# Patient Record
Sex: Female | Born: 1940 | Race: White | Hispanic: No | Marital: Married | Smoking: Former smoker
Health system: Southern US, Community
[De-identification: ages and names within clinical notes are randomized; demographics above are authoritative.]

## PROBLEM LIST (undated history)

## (undated) DIAGNOSIS — Z8719 Personal history of other diseases of the digestive system: Secondary | ICD-10-CM

## (undated) DIAGNOSIS — E7800 Pure hypercholesterolemia, unspecified: Secondary | ICD-10-CM

## (undated) DIAGNOSIS — R0602 Shortness of breath: Secondary | ICD-10-CM

## (undated) DIAGNOSIS — K219 Gastro-esophageal reflux disease without esophagitis: Secondary | ICD-10-CM

## (undated) DIAGNOSIS — T4145XA Adverse effect of unspecified anesthetic, initial encounter: Secondary | ICD-10-CM

## (undated) DIAGNOSIS — I219 Acute myocardial infarction, unspecified: Secondary | ICD-10-CM

## (undated) DIAGNOSIS — K759 Inflammatory liver disease, unspecified: Secondary | ICD-10-CM

## (undated) DIAGNOSIS — I10 Essential (primary) hypertension: Secondary | ICD-10-CM

## (undated) DIAGNOSIS — I2510 Atherosclerotic heart disease of native coronary artery without angina pectoris: Secondary | ICD-10-CM

## (undated) DIAGNOSIS — I209 Angina pectoris, unspecified: Secondary | ICD-10-CM

## (undated) DIAGNOSIS — F419 Anxiety disorder, unspecified: Secondary | ICD-10-CM

## (undated) DIAGNOSIS — M797 Fibromyalgia: Secondary | ICD-10-CM

## (undated) HISTORY — PX: FRACTURE SURGERY: SHX138

## (undated) HISTORY — PX: APPENDECTOMY: SHX54

## (undated) HISTORY — PX: TONSILLECTOMY: SUR1361

## (undated) HISTORY — PX: DILATION AND CURETTAGE OF UTERUS: SHX78

## (undated) HISTORY — PX: ABDOMINAL HYSTERECTOMY: SHX81

## (undated) HISTORY — PX: CORONARY ANGIOPLASTY WITH STENT PLACEMENT: SHX49

---

## 1978-11-13 DIAGNOSIS — K759 Inflammatory liver disease, unspecified: Secondary | ICD-10-CM

## 1978-11-13 DIAGNOSIS — T8859XA Other complications of anesthesia, initial encounter: Secondary | ICD-10-CM

## 1978-11-13 HISTORY — PX: SHOULDER ARTHROSCOPY WITH OPEN ROTATOR CUFF REPAIR: SHX6092

## 1978-11-13 HISTORY — DX: Other complications of anesthesia, initial encounter: T88.59XA

## 1978-11-13 HISTORY — DX: Inflammatory liver disease, unspecified: K75.9

## 1988-11-12 HISTORY — PX: FOOT FRACTURE SURGERY: SHX645

## 1998-07-20 ENCOUNTER — Ambulatory Visit (HOSPITAL_COMMUNITY): Admission: RE | Admit: 1998-07-20 | Discharge: 1998-07-20 | Payer: Self-pay | Admitting: Cardiology

## 1998-07-20 ENCOUNTER — Encounter: Payer: Self-pay | Admitting: Cardiology

## 1999-04-16 ENCOUNTER — Encounter: Payer: Self-pay | Admitting: Cardiology

## 1999-04-16 ENCOUNTER — Ambulatory Visit (HOSPITAL_COMMUNITY): Admission: RE | Admit: 1999-04-16 | Discharge: 1999-04-16 | Payer: Self-pay | Admitting: Cardiology

## 1999-06-15 ENCOUNTER — Encounter: Payer: Self-pay | Admitting: Orthopedic Surgery

## 1999-06-15 ENCOUNTER — Encounter: Admission: RE | Admit: 1999-06-15 | Discharge: 1999-06-15 | Payer: Self-pay | Admitting: Orthopedic Surgery

## 2002-12-27 ENCOUNTER — Encounter: Admission: RE | Admit: 2002-12-27 | Discharge: 2002-12-27 | Payer: Self-pay | Admitting: Orthopedic Surgery

## 2002-12-27 ENCOUNTER — Encounter: Payer: Self-pay | Admitting: Orthopedic Surgery

## 2010-04-04 ENCOUNTER — Encounter: Payer: Self-pay | Admitting: Cardiology

## 2011-09-05 ENCOUNTER — Other Ambulatory Visit (HOSPITAL_COMMUNITY): Payer: Self-pay | Admitting: Cardiology

## 2011-09-05 DIAGNOSIS — R079 Chest pain, unspecified: Secondary | ICD-10-CM

## 2011-09-12 ENCOUNTER — Encounter (HOSPITAL_COMMUNITY)
Admission: RE | Admit: 2011-09-12 | Discharge: 2011-09-12 | Disposition: A | Discharge status: Home / Self Care | Payer: Medicare PPO | Source: Ambulatory Visit | Attending: Cardiology | Admitting: Cardiology

## 2011-09-12 ENCOUNTER — Other Ambulatory Visit: Payer: Self-pay

## 2011-09-12 DIAGNOSIS — R079 Chest pain, unspecified: Secondary | ICD-10-CM | POA: Insufficient documentation

## 2011-09-12 MED ORDER — REGADENOSON 0.4 MG/5ML IV SOLN
0.4000 mg | Freq: Once | INTRAVENOUS | Status: AC
  Administered 2011-09-12: 0.4 mg via INTRAVENOUS

## 2011-09-12 MED ORDER — TECHNETIUM TC 99M TETROFOSMIN IV KIT
30.0000 | PACK | Freq: Once | INTRAVENOUS | Status: AC | PRN
  Administered 2011-09-12: 30 via INTRAVENOUS

## 2011-09-12 MED ORDER — TECHNETIUM TC 99M TETROFOSMIN IV KIT
10.0000 | PACK | Freq: Once | INTRAVENOUS | Status: AC | PRN
  Administered 2011-09-12: 10 via INTRAVENOUS

## 2011-09-12 MED ORDER — REGADENOSON 0.4 MG/5ML IV SOLN
INTRAVENOUS | Status: AC
  Filled 2011-09-12: qty 5

## 2011-09-14 MED ORDER — HEPARIN (PORCINE) IN NACL 2-0.9 UNIT/ML-% IJ SOLN
INTRAMUSCULAR | Status: AC
  Filled 2011-09-14: qty 1000

## 2011-09-14 MED ORDER — HEPARIN SODIUM (PORCINE) 1000 UNIT/ML IJ SOLN
INTRAMUSCULAR | Status: AC
  Filled 2011-09-14: qty 1

## 2011-09-14 MED ORDER — CLOPIDOGREL BISULFATE 300 MG PO TABS
ORAL_TABLET | ORAL | Status: AC
  Filled 2011-09-14: qty 1

## 2011-09-14 MED ORDER — FENTANYL CITRATE 0.05 MG/ML IJ SOLN
INTRAMUSCULAR | Status: AC
  Filled 2011-09-14: qty 2

## 2011-09-14 MED ORDER — FAMOTIDINE IN NACL 20-0.9 MG/50ML-% IV SOLN
INTRAVENOUS | Status: AC
  Filled 2011-09-14: qty 50

## 2011-09-14 MED ORDER — LIDOCAINE HCL (PF) 1 % IJ SOLN
INTRAMUSCULAR | Status: AC
  Filled 2011-09-14: qty 30

## 2011-09-14 MED ORDER — MIDAZOLAM HCL 2 MG/2ML IJ SOLN
INTRAMUSCULAR | Status: AC
  Filled 2011-09-14: qty 2

## 2011-09-20 ENCOUNTER — Encounter (HOSPITAL_COMMUNITY): Payer: Self-pay | Admitting: Pharmacy Technician

## 2011-09-22 ENCOUNTER — Encounter (HOSPITAL_COMMUNITY)
Admission: RE | Disposition: A | Discharge status: Home / Self Care | Payer: Self-pay | Source: Ambulatory Visit | Attending: Cardiology

## 2011-09-22 ENCOUNTER — Encounter (HOSPITAL_COMMUNITY): Payer: Self-pay | Admitting: General Practice

## 2011-09-22 ENCOUNTER — Ambulatory Visit (HOSPITAL_COMMUNITY)
Admission: RE | Admit: 2011-09-22 | Discharge: 2011-09-23 | Disposition: A | Discharge status: Home / Self Care | Payer: Medicare PPO | Source: Ambulatory Visit | Attending: Cardiology | Admitting: Cardiology

## 2011-09-22 DIAGNOSIS — Z955 Presence of coronary angioplasty implant and graft: Secondary | ICD-10-CM

## 2011-09-22 DIAGNOSIS — E119 Type 2 diabetes mellitus without complications: Secondary | ICD-10-CM | POA: Insufficient documentation

## 2011-09-22 DIAGNOSIS — I2510 Atherosclerotic heart disease of native coronary artery without angina pectoris: Secondary | ICD-10-CM | POA: Insufficient documentation

## 2011-09-22 DIAGNOSIS — E7800 Pure hypercholesterolemia, unspecified: Secondary | ICD-10-CM | POA: Insufficient documentation

## 2011-09-22 DIAGNOSIS — I10 Essential (primary) hypertension: Secondary | ICD-10-CM | POA: Insufficient documentation

## 2011-09-22 DIAGNOSIS — F17200 Nicotine dependence, unspecified, uncomplicated: Secondary | ICD-10-CM | POA: Insufficient documentation

## 2011-09-22 DIAGNOSIS — Z8249 Family history of ischemic heart disease and other diseases of the circulatory system: Secondary | ICD-10-CM | POA: Insufficient documentation

## 2011-09-22 HISTORY — DX: Gastro-esophageal reflux disease without esophagitis: K21.9

## 2011-09-22 HISTORY — DX: Fibromyalgia: M79.7

## 2011-09-22 HISTORY — DX: Essential (primary) hypertension: I10

## 2011-09-22 HISTORY — DX: Angina pectoris, unspecified: I20.9

## 2011-09-22 HISTORY — DX: Personal history of other diseases of the digestive system: Z87.19

## 2011-09-22 HISTORY — DX: Shortness of breath: R06.02

## 2011-09-22 HISTORY — DX: Adverse effect of unspecified anesthetic, initial encounter: T41.45XA

## 2011-09-22 HISTORY — PX: PERCUTANEOUS CORONARY STENT INTERVENTION (PCI-S): SHX5485

## 2011-09-22 HISTORY — PX: LEFT HEART CATHETERIZATION WITH CORONARY ANGIOGRAM: SHX5451

## 2011-09-22 HISTORY — DX: Anxiety disorder, unspecified: F41.9

## 2011-09-22 HISTORY — DX: Atherosclerotic heart disease of native coronary artery without angina pectoris: I25.10

## 2011-09-22 LAB — GLUCOSE, CAPILLARY
Glucose-Capillary: 85 mg/dL (ref 70–99)
Glucose-Capillary: 118 mg/dL — ABNORMAL HIGH (ref 70–99)

## 2011-09-22 LAB — POCT ACTIVATED CLOTTING TIME: Activated Clotting Time: 334 seconds

## 2011-09-22 SURGERY — LEFT HEART CATHETERIZATION WITH CORONARY ANGIOGRAM
Anesthesia: LOCAL

## 2011-09-22 MED ORDER — NITROGLYCERIN IN D5W 200-5 MCG/ML-% IV SOLN
5.0000 ug/min | INTRAVENOUS | Status: DC
  Administered 2011-09-22: 5 ug/min via INTRAVENOUS

## 2011-09-22 MED ORDER — PANTOPRAZOLE SODIUM 40 MG PO TBEC
40.0000 mg | DELAYED_RELEASE_TABLET | Freq: Every day | ORAL | Status: DC
  Administered 2011-09-23 (×2): 40 mg via ORAL
  Filled 2011-09-22 (×2): qty 1

## 2011-09-22 MED ORDER — MIDAZOLAM HCL 2 MG/2ML IJ SOLN
INTRAMUSCULAR | Status: AC
  Filled 2011-09-22: qty 2

## 2011-09-22 MED ORDER — SODIUM CHLORIDE 0.9 % IJ SOLN: 3.0000 mL | INTRAMUSCULAR | Status: DC | PRN

## 2011-09-22 MED ORDER — METOPROLOL TARTRATE 12.5 MG HALF TABLET
12.5000 mg | ORAL_TABLET | Freq: Two times a day (BID) | ORAL | Status: DC
  Administered 2011-09-22 (×2): 12.5 mg via ORAL
  Filled 2011-09-22 (×4): qty 1

## 2011-09-22 MED ORDER — DIAZEPAM 5 MG PO TABS
ORAL_TABLET | ORAL | Status: AC
  Administered 2011-09-22: 5 mg via ORAL
  Filled 2011-09-22: qty 1

## 2011-09-22 MED ORDER — ACETAMINOPHEN 325 MG PO TABS: 650.0000 mg | ORAL_TABLET | ORAL | Status: DC | PRN

## 2011-09-22 MED ORDER — ONDANSETRON HCL 4 MG/2ML IJ SOLN: 4.0000 mg | Freq: Four times a day (QID) | INTRAMUSCULAR | Status: DC | PRN

## 2011-09-22 MED ORDER — ALPRAZOLAM 0.25 MG PO TABS: 0.2500 mg | ORAL_TABLET | Freq: Two times a day (BID) | ORAL | Status: DC | PRN

## 2011-09-22 MED ORDER — FENTANYL CITRATE 0.05 MG/ML IJ SOLN
INTRAMUSCULAR | Status: AC
  Filled 2011-09-22: qty 2

## 2011-09-22 MED ORDER — SODIUM CHLORIDE 0.9 % IV SOLN
INTRAVENOUS | Status: DC
  Administered 2011-09-22: 1000 mL via INTRAVENOUS

## 2011-09-22 MED ORDER — ASPIRIN 81 MG PO CHEW
CHEWABLE_TABLET | ORAL | Status: AC
  Administered 2011-09-22: 324 mg via ORAL
  Filled 2011-09-22: qty 4

## 2011-09-22 MED ORDER — BIVALIRUDIN 250 MG IV SOLR
INTRAVENOUS | Status: AC
  Filled 2011-09-22: qty 250

## 2011-09-22 MED ORDER — TICAGRELOR 90 MG PO TABS
90.0000 mg | ORAL_TABLET | Freq: Two times a day (BID) | ORAL | Status: DC
  Administered 2011-09-22: 90 mg via ORAL
  Filled 2011-09-22 (×4): qty 1

## 2011-09-22 MED ORDER — ASPIRIN 81 MG PO CHEW
324.0000 mg | CHEWABLE_TABLET | ORAL | Status: AC
  Administered 2011-09-22: 324 mg via ORAL

## 2011-09-22 MED ORDER — NITROGLYCERIN IN D5W 200-5 MCG/ML-% IV SOLN
INTRAVENOUS | Status: AC
  Filled 2011-09-22: qty 250

## 2011-09-22 MED ORDER — ASPIRIN 81 MG PO CHEW
81.0000 mg | CHEWABLE_TABLET | Freq: Every day | ORAL | Status: DC
  Administered 2011-09-23: 11:00:00 81 mg via ORAL
  Filled 2011-09-22 (×2): qty 1

## 2011-09-22 MED ORDER — TICAGRELOR 90 MG PO TABS
ORAL_TABLET | ORAL | Status: AC
  Filled 2011-09-22: qty 2

## 2011-09-22 MED ORDER — SODIUM CHLORIDE 0.9 % IV SOLN
INTRAVENOUS | Status: AC
  Administered 2011-09-22 (×2): via INTRAVENOUS

## 2011-09-22 MED ORDER — NITROGLYCERIN 0.2 MG/ML ON CALL CATH LAB
INTRAVENOUS | Status: AC
  Filled 2011-09-22: qty 1

## 2011-09-22 MED ORDER — ATORVASTATIN CALCIUM 80 MG PO TABS
80.0000 mg | ORAL_TABLET | Freq: Every day | ORAL | Status: DC
  Administered 2011-09-22: 22:00:00 80 mg via ORAL
  Filled 2011-09-22 (×2): qty 1

## 2011-09-22 MED ORDER — DIAZEPAM 5 MG PO TABS
5.0000 mg | ORAL_TABLET | ORAL | Status: AC
  Administered 2011-09-22: 5 mg via ORAL

## 2011-09-22 MED ORDER — LIDOCAINE HCL (PF) 1 % IJ SOLN
INTRAMUSCULAR | Status: AC
  Filled 2011-09-22: qty 30

## 2011-09-22 MED ORDER — SODIUM CHLORIDE 0.9 % IJ SOLN: 3.0000 mL | Freq: Two times a day (BID) | INTRAMUSCULAR | Status: DC

## 2011-09-22 MED ORDER — SODIUM CHLORIDE 0.9 % IV SOLN: 250.0000 mL | INTRAVENOUS | Status: DC | PRN

## 2011-09-22 MED ORDER — TICAGRELOR 90 MG PO TABS
ORAL_TABLET | ORAL | Status: AC
  Administered 2011-09-22: 90 mg via ORAL
  Filled 2011-09-22: qty 2

## 2011-09-22 MED ORDER — HEPARIN (PORCINE) IN NACL 2-0.9 UNIT/ML-% IJ SOLN
INTRAMUSCULAR | Status: AC
  Filled 2011-09-22: qty 2000

## 2011-09-22 NOTE — CV Procedure (Signed)
Left cardiac cath/PTCA stenting report dictated on 09/22/2011 the patient number is [id number]

## 2011-09-23 LAB — BASIC METABOLIC PANEL
CO2: 26 mEq/L (ref 19–32)
Calcium: 9.3 mg/dL (ref 8.4–10.5)
Creatinine, Ser: 0.84 mg/dL (ref 0.50–1.10)
GFR calc Af Amer: 80 mL/min — ABNORMAL LOW (ref >90)
GFR calc non Af Amer: 69 mL/min — ABNORMAL LOW (ref >90)
Sodium: 139 mEq/L (ref 135–145)

## 2011-09-23 LAB — CBC
MCH: 29.1 pg (ref 26.0–34.0)
MCHC: 34.5 g/dL (ref 30.0–36.0)
MCV: 84.2 fL (ref 78.0–100.0)
Platelets: 187 10*3/uL (ref 150–400)
RBC: 4.95 MIL/uL (ref 3.87–5.11)
RDW: 13.2 % (ref 11.5–15.5)

## 2011-09-23 MED ORDER — LISINOPRIL 10 MG PO TABS: 10.0000 mg | ORAL_TABLET | Freq: Every day | ORAL | Status: DC

## 2011-09-23 MED ORDER — METOPROLOL SUCCINATE ER 25 MG PO TB24: 25.0000 mg | ORAL_TABLET | Freq: Every day | ORAL | Status: DC

## 2011-09-23 MED ORDER — PANTOPRAZOLE SODIUM 40 MG PO TBEC: 40.0000 mg | DELAYED_RELEASE_TABLET | Freq: Every day | ORAL | Status: DC

## 2011-09-23 MED ORDER — NITROGLYCERIN 0.4 MG SL SUBL: 0.4000 mg | SUBLINGUAL_TABLET | SUBLINGUAL | Status: DC | PRN

## 2011-09-23 MED ORDER — ASPIRIN 81 MG PO CHEW: 81.0000 mg | CHEWABLE_TABLET | Freq: Every day | ORAL | Status: AC

## 2011-09-23 MED ORDER — NITROGLYCERIN 0.3 MG SL SUBL: 0.3000 mg | SUBLINGUAL_TABLET | SUBLINGUAL | Status: DC | PRN

## 2011-09-23 MED ORDER — TICAGRELOR 90 MG PO TABS: 90.0000 mg | ORAL_TABLET | Freq: Two times a day (BID) | ORAL | Status: DC

## 2011-09-23 MED ORDER — ATORVASTATIN CALCIUM 80 MG PO TABS: 80.0000 mg | ORAL_TABLET | Freq: Every day | ORAL | Status: DC

## 2011-09-23 MED FILL — Dextrose Inj 5%: INTRAVENOUS | Qty: 1000 | Status: AC

## 2011-09-23 NOTE — Discharge Summary (Signed)
  Discharge summary dictated on 09/23/2011 dictation number is [id number]

## 2011-09-23 NOTE — Cardiovascular Report (Signed)
NAMESUDIE, BANDEL          ACCOUNT NO.:  [id number]  MEDICAL RECORD NO.:  [id number]  LOCATION:  6531                         FACILITY:  [facility]  PHYSICIAN:  Eduardo Osier. Sharyn Lull, M.D. DATE OF BIRTH:  30-Oct-1940  DATE OF PROCEDURE:  09/22/2011 DATE OF DISCHARGE:                           CARDIAC CATHETERIZATION   PROCEDURES: 1. Left cardiac cath with selective left and right coronary     angiography, left ventriculography via right groin using Judkins     technique. 2. Successful PTCA to chronically occluded mid and distal left     circumflex using initially 1.5 x 15-mm long Trek balloon and then     2.5 x 20-mm long Trek balloon. 3. Successful deployment of 2.5 x 38-mm long Xience Xpedition drug-     eluting stent in mid and distal left circumflex. 4. Successful postdilatation of this stent using 2.75 x 20-mm long [place]     Trek balloon.  INDICATION FOR THE PROCEDURE:  Ms. Kingsberry is a 71 year old female with past medical history significant for hypertension, non-insulin-dependent diabetes mellitus controlled by diet, hypercholesteremia, history of anxiety disorder, tobacco abuse, strong family history of coronary artery disease, complains of vague retrosternal chest pain associated with palpitation off and on.  Denies any nausea, vomiting, diaphoresis. Denies shortness of breath.  Denies PND, orthopnea or leg swelling. Denies lightheadedness or syncope.  The patient subsequently underwent Lexiscan Myoview, which showed moderate-sized area of mild reversible ischemia in the apical and midsegment of inferolateral wall with EF of 74%.  Discussed with the patient regarding stress test results and various options of treatment, and consented for PCI.  PROCEDURE:  After obtaining the informed consent, the patient was brought to the Cath Lab and was placed on fluoroscopy table.  Right groin was prepped and draped in usual fashion.  Xylocaine 1% was used for local anesthesia in the  right groin.  With the help of thin wall needle, 6-French arterial sheath was placed.  The sheath was aspirated and flushed.  Next, 6-French left Judkins catheter was advanced over the wire under fluoroscopic guidance up to the ascending aorta.  Wire was pulled out, the catheter was aspirated and connected to the Manifold. Catheter was further advanced and engaged into left coronary ostium. Multiple views of the left system were taken.  Next, catheter was disengaged and was pulled out over the wire and was replaced with 6- [place] right Judkins catheter, which was advanced over the wire under fluoroscopic guidance up to the ascending aorta.  Wire was pulled out, the catheter was aspirated and connected to the Manifold.  Catheter was further advanced and engaged into right coronary ostium.  Multiple views of the right system were taken.  Next, catheter was disengaged and was pulled out over the wire and was replaced with 6-French pigtail catheter, which was advanced over the wire under fluoroscopic guidance up to the ascending aorta.  Wire was pulled out, the catheter was aspirated and connected to the Manifold.  Catheter was further advanced across the aortic valve into the LV.  LV pressures were recorded.  Next, left ventriculography was done in 30-degree RAO position.  Post- angiographic pressures were recorded from LV and then pullback pressures were recorded from  the aorta.  There was no significant gradient across the aortic valve.  Next, the pigtail catheter was pulled out over the wire.  Sheaths were aspirated and flushed.  FINDINGS:  LV showed good LV systolic function, EF of 60-65%.  Left main was patent.  LAD has 15-25% proximal sequential stenosis and 40-50% mid- bifurcation stenosis with diagonal 1.  Diagonal 1 has 50-60% ostial stenosis.  LAD and diagonals are heavily calcified.  Ramus was moderate size, which has 15-20% proximal stenosis.  Left circumflex has 30-40% proximal  and 70-75% mid long stenosis and then it is subtotally occluded filling from collaterals from RCA.  RCA has 10-15% proximal and mid- stenosis.  PDA and PLV branches were patent and they were providing collaterals to distal left circumflex.  INTERVENTIONAL PROCEDURE:  Successful PTCA to mid and distal left circumflex was done using 1.5 x 15-mm long Trek balloon for predilatation and then 2.5 x 20-mm long Trek balloon for predilatation, and then 2.5 x 38-mm long Xience Xpedition drug-eluting stent was deployed in mid and distal left circumflex at 11 atmospheric pressure. The stent was postdilated using 2.75 x 20-mm long [place] Trek balloon going up to 13 and 15 atmospheric pressure.  Lesion dilated from 100% to 0% ostial with excellent TIMI grade 3 distal flow without evidence of dissection or distal embolization.  The patient received weight-based Angiomax and 180 mg of Brilinta during the procedure.  The patient tolerated the procedure well.  There were no complications.  The patient was transferred to the recovery room in stable condition.  At the end of the procedure, arteriotomy was closed with Perclose without any complications with good hemostasis.     Eduardo Osier. Sharyn Lull, M.D.     [facility]/MEDQ  D:  09/22/2011  T:  09/23/2011  Job:  [id number]

## 2011-09-23 NOTE — Progress Notes (Signed)
CARDIAC REHAB PHASE I   PRE:  Rate/Rhythm: 78 SR    BP: sitting 131/73    SaO2:   MODE:  Ambulation: 450 ft   POST:  Rate/Rhythm: 94    BP: sitting 136/75     SaO2:   Tolerated well, feels good. Ed completed and pt requests her name be sent to [place] [facility]. Very motivated to quit smoking. Resources given. [id number]  Harriet Masson CES, ACSM

## 2011-09-23 NOTE — Progress Notes (Signed)
Discussed smoking cessation, tips for success.  Pt states has never quit before but ready to try.  Quitline phone # given.  Encouragement provided.

## 2011-09-23 NOTE — Discharge Summary (Signed)
Emily Palmer, Emily Palmer          ACCOUNT NO.:  [id number]  MEDICAL RECORD NO.:  [id number]  LOCATION:  6531                         FACILITY:  [facility]  PHYSICIAN:  Eduardo Osier. Sharyn Lull, M.D. DATE OF BIRTH:  04-13-1940  DATE OF ADMISSION:  09/22/2011 DATE OF DISCHARGE:  09/23/2011                              DISCHARGE SUMMARY   ADMITTING DIAGNOSES: 1. Chest pain, positive Lexiscan Myoview, rule out coronary     insufficiency. 2. Hypertension. 3. Non-insulin-dependent diabetes mellitus, controlled by diet. 4. Hypercholesteremia. 5. Tobacco abuse. 6. Positive family history of coronary artery disease.  DISCHARGE DIAGNOSES: 1. Status post chest pain, positive Lexiscan scan Myoview, status post     left cardiac catheterization/percutaneous transluminal coronary     angioplasty stenting to chronically occluded left circumflex. 2. Hypertension. 3. Non-insulin-dependent diabetes mellitus, controlled by diet. 4. Hypercholesteremia. 5. Tobacco abuse. 6. Positive family history of coronary artery disease.  DISCHARGE HOME MEDICATIONS: 1. Enteric-coated aspirin 81 mg 1 tablet daily. 2. Brilinta 90 mg 1 tablet twice daily. 3. Atorvastatin 80 mg 1 tablet daily. 4. Lisinopril 10 mg 1 tablet daily. 5. Toprol-XL 25 mg 1 tablet daily. 6. Protonix 40 mg 1 tablet daily. 7. Nitrostat 0.4 mg sublingual use as directed. 8. Vitamin E 1 tablet daily as before.  DIET:  Low-salt, low-cholesterol, 1800 calories ADA diet.  Postcardiac cath/PTCA stent instructions have been given.  Follow up with me in 1 week.  The patient has been advised regarding lifestyle modification and smoking cessation to which she agrees.  The patient will be scheduled for phase 2 cardiac rehab as outpatient.  CONDITION AT DISCHARGE:  Stable.  BRIEF HISTORY AND HOSPITAL COURSE:  The patient is a 71 year old female with past medical history significant for hypertension, non-insulin- dependent diabetes mellitus controlled by  diet, hypercholesteremia, history of anxiety disorder, tobacco abuse, positive family history of coronary artery disease, complains of vague retrosternal chest pain associated with palpitation off and on.  Denies any nausea, vomiting, or diaphoresis.  Denies shortness of breath.  Denies PND, orthopnea, or leg swelling.  Denies lightheadedness or syncope.  The patient subsequently underwent Lexiscan Myoview which showed moderate area of mild reversible ischemia in the apical and mid segment of inferolateral wall with EF of 74%.  The patient was electively admitted for left cath, possible PCI.  PAST MEDICAL HISTORY:  As above.  PAST SURGICAL HISTORY:  She had hysterectomy in the past, had right shoulder surgery in the past, had tonsillectomy in the past.  MEDICATION AT HOME:  She was on aspirin, lisinopril HCT, simvastatin, Xanax.  ALLERGIES:  She is allergic to CODEINE and TYLENOL.  SOCIAL HISTORY:  She is married, 4 children.  Smoked 1-1/2 packs per day for 40 years.  No history of alcohol abuse.  She is a retired Engineer, civil (consulting).  FAMILY HISTORY:  Positive for coronary artery disease.  PHYSICAL EXAMINATION:  GENERAL:  She is alert, awake, oriented x3, in no acute distress. VITAL SIGNS:  Blood pressure was 140/80.  Pulse was 72 and regular. HEENT:  Conjunctivae were pink. NECK:  Supple.  No JVD.  No bruit. LUNGS:  Clear to auscultation without rhonchi or rales. CARDIOVASCULAR:  S1 and S2 were normal.  There was soft  systolic murmur. There was no S3 or gallop. ABDOMEN:  Soft.  Bowel sounds were present.  Nontender. EXTREMITIES:  There was no clubbing, cyanosis, or edema.  LABORATORY DATA:  Her blood sugar has been anywhere from 85-118.  Sodium was 139, potassium 3.7, BUN 8, creatinine 0.84.  Today, fasting blood sugar is 87, hemoglobin is 14.4, hematocrit 41.7, white count of 9.9. EKG showed normal sinus rhythm with no acute ischemic changes.  BRIEF HOSPITAL COURSE:  The patient was  a.m. admit and underwent left cardiac cath with selective left and right coronary angiography and PTCA and stenting to chronically occluded left circumflex as per procedure report.  The patient tolerated the procedure well.  There were no complications.  Postprocedure, the patient did not have any episode of anginal chest pain.  Phase 1 cardiac rehab was called.  The patient has been ambulating in hallway without any problems.  Her groin is stable with no evidence of hematoma or bruit.  The patient was extensively counseled regarding lifestyle modification and smoking cessation to which she agrees.  The patient will be discharged home today and will be followed up in my office in 1 week.  The patient will be scheduled for phase 2 cardiac rehab as outpatient.    Eduardo Osier. Sharyn Lull, M.D.    [facility]/MEDQ  D:  09/23/2011  T:  09/23/2011  Job:  [id number]

## 2012-09-26 ENCOUNTER — Other Ambulatory Visit (HOSPITAL_COMMUNITY): Payer: Self-pay | Admitting: Cardiology

## 2012-09-26 DIAGNOSIS — R079 Chest pain, unspecified: Secondary | ICD-10-CM

## 2012-10-03 ENCOUNTER — Other Ambulatory Visit: Payer: Self-pay

## 2012-10-03 ENCOUNTER — Encounter (HOSPITAL_COMMUNITY)
Admission: RE | Admit: 2012-10-03 | Discharge: 2012-10-03 | Disposition: A | Discharge status: Home / Self Care | Payer: Medicare PPO | Source: Ambulatory Visit | Attending: Cardiology | Admitting: Cardiology

## 2012-10-03 DIAGNOSIS — R0789 Other chest pain: Secondary | ICD-10-CM | POA: Insufficient documentation

## 2012-10-03 DIAGNOSIS — I4949 Other premature depolarization: Secondary | ICD-10-CM | POA: Insufficient documentation

## 2012-10-03 DIAGNOSIS — I499 Cardiac arrhythmia, unspecified: Secondary | ICD-10-CM | POA: Insufficient documentation

## 2012-10-03 DIAGNOSIS — R079 Chest pain, unspecified: Secondary | ICD-10-CM

## 2012-10-03 DIAGNOSIS — I10 Essential (primary) hypertension: Secondary | ICD-10-CM | POA: Insufficient documentation

## 2012-10-03 DIAGNOSIS — I2510 Atherosclerotic heart disease of native coronary artery without angina pectoris: Secondary | ICD-10-CM | POA: Insufficient documentation

## 2012-10-03 MED ORDER — TECHNETIUM TC 99M SESTAMIBI GENERIC - CARDIOLITE
10.0000 | Freq: Once | INTRAVENOUS | Status: AC | PRN
  Administered 2012-10-03: 10 via INTRAVENOUS

## 2012-10-03 MED ORDER — REGADENOSON 0.4 MG/5ML IV SOLN
0.4000 mg | Freq: Once | INTRAVENOUS | Status: AC
  Administered 2012-10-03: 0.4 mg via INTRAVENOUS

## 2012-10-03 MED ORDER — REGADENOSON 0.4 MG/5ML IV SOLN
INTRAVENOUS | Status: AC
  Filled 2012-10-03: qty 5

## 2012-10-03 MED ORDER — TECHNETIUM TC 99M SESTAMIBI GENERIC - CARDIOLITE
30.0000 | Freq: Once | INTRAVENOUS | Status: AC | PRN
  Administered 2012-10-03: 30 via INTRAVENOUS

## 2014-02-20 ENCOUNTER — Encounter (HOSPITAL_COMMUNITY): Payer: Self-pay | Admitting: Cardiology

## 2014-07-14 ENCOUNTER — Other Ambulatory Visit: Payer: Self-pay | Admitting: Family Medicine

## 2014-07-14 ENCOUNTER — Ambulatory Visit
Admission: RE | Admit: 2014-07-14 | Discharge: 2014-07-14 | Disposition: A | Discharge status: Home / Self Care | Payer: Medicare PPO | Source: Ambulatory Visit | Attending: Family Medicine | Admitting: Family Medicine

## 2014-07-14 DIAGNOSIS — M25552 Pain in left hip: Secondary | ICD-10-CM

## 2014-09-18 ENCOUNTER — Encounter (HOSPITAL_COMMUNITY): Payer: Self-pay | Admitting: Emergency Medicine

## 2014-09-18 ENCOUNTER — Inpatient Hospital Stay (HOSPITAL_COMMUNITY)
Admission: EM | Admit: 2014-09-18 | Discharge: 2014-09-22 | DRG: 392 | Disposition: A | Discharge status: Home / Self Care | Payer: Medicare PPO | Attending: Cardiology | Admitting: Cardiology

## 2014-09-18 ENCOUNTER — Emergency Department (HOSPITAL_COMMUNITY): Payer: Medicare PPO

## 2014-09-18 DIAGNOSIS — K219 Gastro-esophageal reflux disease without esophagitis: Secondary | ICD-10-CM | POA: Diagnosis present

## 2014-09-18 DIAGNOSIS — R1032 Left lower quadrant pain: Secondary | ICD-10-CM | POA: Diagnosis not present

## 2014-09-18 DIAGNOSIS — I252 Old myocardial infarction: Secondary | ICD-10-CM

## 2014-09-18 DIAGNOSIS — R109 Unspecified abdominal pain: Secondary | ICD-10-CM

## 2014-09-18 DIAGNOSIS — Z888 Allergy status to other drugs, medicaments and biological substances status: Secondary | ICD-10-CM

## 2014-09-18 DIAGNOSIS — E780 Pure hypercholesterolemia: Secondary | ICD-10-CM | POA: Diagnosis present

## 2014-09-18 DIAGNOSIS — Z9861 Coronary angioplasty status: Secondary | ICD-10-CM | POA: Diagnosis not present

## 2014-09-18 DIAGNOSIS — M5416 Radiculopathy, lumbar region: Secondary | ICD-10-CM | POA: Diagnosis present

## 2014-09-18 DIAGNOSIS — I10 Essential (primary) hypertension: Secondary | ICD-10-CM | POA: Diagnosis present

## 2014-09-18 DIAGNOSIS — N390 Urinary tract infection, site not specified: Secondary | ICD-10-CM | POA: Diagnosis present

## 2014-09-18 DIAGNOSIS — E119 Type 2 diabetes mellitus without complications: Secondary | ICD-10-CM | POA: Diagnosis present

## 2014-09-18 DIAGNOSIS — I2510 Atherosclerotic heart disease of native coronary artery without angina pectoris: Secondary | ICD-10-CM | POA: Diagnosis present

## 2014-09-18 DIAGNOSIS — M1990 Unspecified osteoarthritis, unspecified site: Secondary | ICD-10-CM | POA: Diagnosis present

## 2014-09-18 DIAGNOSIS — Z87891 Personal history of nicotine dependence: Secondary | ICD-10-CM

## 2014-09-18 DIAGNOSIS — K449 Diaphragmatic hernia without obstruction or gangrene: Secondary | ICD-10-CM | POA: Diagnosis present

## 2014-09-18 DIAGNOSIS — F419 Anxiety disorder, unspecified: Secondary | ICD-10-CM | POA: Diagnosis present

## 2014-09-18 DIAGNOSIS — N179 Acute kidney failure, unspecified: Secondary | ICD-10-CM | POA: Diagnosis present

## 2014-09-18 DIAGNOSIS — E860 Dehydration: Secondary | ICD-10-CM | POA: Diagnosis present

## 2014-09-18 DIAGNOSIS — K529 Noninfective gastroenteritis and colitis, unspecified: Secondary | ICD-10-CM | POA: Diagnosis present

## 2014-09-18 DIAGNOSIS — Z90710 Acquired absence of both cervix and uterus: Secondary | ICD-10-CM

## 2014-09-18 DIAGNOSIS — Z882 Allergy status to sulfonamides status: Secondary | ICD-10-CM | POA: Diagnosis not present

## 2014-09-18 LAB — URINALYSIS, ROUTINE W REFLEX MICROSCOPIC
BILIRUBIN URINE: NEGATIVE
GLUCOSE, UA: NEGATIVE mg/dL
Ketones, ur: NEGATIVE mg/dL
Nitrite: NEGATIVE
Protein, ur: NEGATIVE mg/dL
SPECIFIC GRAVITY, URINE: 1.007 (ref 1.005–1.030)
Urobilinogen, UA: 0.2 mg/dL (ref 0.0–1.0)
pH: 5 (ref 5.0–8.0)

## 2014-09-18 LAB — POC OCCULT BLOOD, ED: Fecal Occult Bld: NEGATIVE

## 2014-09-18 LAB — TYPE AND SCREEN
ABO/RH(D): A POS
ANTIBODY SCREEN: NEGATIVE

## 2014-09-18 LAB — CBC WITH DIFFERENTIAL/PLATELET
Basophils Absolute: 0 10*3/uL (ref 0.0–0.1)
Basophils Relative: 0 % (ref 0–1)
EOS ABS: 0.2 10*3/uL (ref 0.0–0.7)
EOS PCT: 1 % (ref 0–5)
HCT: 45.6 % (ref 36.0–46.0)
Hemoglobin: 15.9 g/dL — ABNORMAL HIGH (ref 12.0–15.0)
LYMPHS ABS: 3.2 10*3/uL (ref 0.7–4.0)
LYMPHS PCT: 15 % (ref 12–46)
MCH: 30.1 pg (ref 26.0–34.0)
MCHC: 34.9 g/dL (ref 30.0–36.0)
MCV: 86.4 fL (ref 78.0–100.0)
MONO ABS: 1.5 10*3/uL — AB (ref 0.1–1.0)
MONOS PCT: 7 % (ref 3–12)
Neutro Abs: 15.7 10*3/uL — ABNORMAL HIGH (ref 1.7–7.7)
Neutrophils Relative %: 77 % (ref 43–77)
Platelets: 328 10*3/uL (ref 150–400)
RBC: 5.28 MIL/uL — ABNORMAL HIGH (ref 3.87–5.11)
RDW: 13 % (ref 11.5–15.5)
WBC: 20.6 10*3/uL — ABNORMAL HIGH (ref 4.0–10.5)

## 2014-09-18 LAB — COMPREHENSIVE METABOLIC PANEL
ALT: 50 U/L (ref 14–54)
ANION GAP: 14 (ref 5–15)
AST: 35 U/L (ref 15–41)
Albumin: 4.4 g/dL (ref 3.5–5.0)
Alkaline Phosphatase: 56 U/L (ref 38–126)
BUN: 45 mg/dL — AB (ref 6–20)
CALCIUM: 10 mg/dL (ref 8.9–10.3)
CO2: 25 mmol/L (ref 22–32)
CREATININE: 1.95 mg/dL — AB (ref 0.44–1.00)
Chloride: 96 mmol/L — ABNORMAL LOW (ref 101–111)
GFR calc Af Amer: 28 mL/min — ABNORMAL LOW (ref >60)
GFR, EST NON AFRICAN AMERICAN: 24 mL/min — AB (ref >60)
Glucose, Bld: 147 mg/dL — ABNORMAL HIGH (ref 65–99)
Potassium: 3.8 mmol/L (ref 3.5–5.1)
Sodium: 135 mmol/L (ref 135–145)
Total Bilirubin: 1 mg/dL (ref 0.3–1.2)
Total Protein: 8 g/dL (ref 6.5–8.1)

## 2014-09-18 LAB — LIPASE, BLOOD: LIPASE: 60 U/L — AB (ref 22–51)

## 2014-09-18 LAB — URINE MICROSCOPIC-ADD ON

## 2014-09-18 MED ORDER — METRONIDAZOLE IN NACL 5-0.79 MG/ML-% IV SOLN
500.0000 mg | Freq: Once | INTRAVENOUS | Status: AC
  Administered 2014-09-18: 500 mg via INTRAVENOUS
  Filled 2014-09-18: qty 100

## 2014-09-18 MED ORDER — DEXTROSE 5 % IV SOLN: 1.0000 g | INTRAVENOUS | Status: DC

## 2014-09-18 MED ORDER — SODIUM CHLORIDE 0.9 % IV SOLN
INTRAVENOUS | Status: DC
  Administered 2014-09-18: 20:00:00 via INTRAVENOUS

## 2014-09-18 MED ORDER — SODIUM CHLORIDE 0.9 % IV BOLUS (SEPSIS)
1000.0000 mL | Freq: Once | INTRAVENOUS | Status: AC
  Administered 2014-09-18: 1000 mL via INTRAVENOUS

## 2014-09-18 MED ORDER — IOHEXOL 300 MG/ML  SOLN
25.0000 mL | Freq: Once | INTRAMUSCULAR | Status: AC | PRN
  Administered 2014-09-18: 25 mL via ORAL

## 2014-09-18 MED ORDER — HYDROMORPHONE HCL 1 MG/ML IJ SOLN
0.5000 mg | Freq: Once | INTRAMUSCULAR | Status: AC
  Administered 2014-09-18: 0.5 mg via INTRAVENOUS
  Filled 2014-09-18: qty 1

## 2014-09-18 MED ORDER — CIPROFLOXACIN IN D5W 400 MG/200ML IV SOLN
400.0000 mg | Freq: Once | INTRAVENOUS | Status: AC
  Administered 2014-09-18: 400 mg via INTRAVENOUS
  Filled 2014-09-18: qty 200

## 2014-09-18 MED ORDER — ONDANSETRON HCL 4 MG/2ML IJ SOLN
4.0000 mg | Freq: Once | INTRAMUSCULAR | Status: AC
  Administered 2014-09-18: 4 mg via INTRAVENOUS
  Filled 2014-09-18: qty 2

## 2014-09-18 NOTE — ED Notes (Signed)
Admitting MD at bedside.

## 2014-09-18 NOTE — ED Notes (Signed)
Pt c/o diarrhea, blood in stool, emesis, nausea, and LLQ abdominal pain radiating RLQ.

## 2014-09-18 NOTE — ED Provider Notes (Signed)
CSN: [id number]     Arrival date & time 09/18/14  1635 History   First MD Initiated Contact with Patient 09/18/14 1719     Chief Complaint  Patient presents with  . Diarrhea  . Melena     (Consider location/radiation/quality/duration/timing/severity/associated sxs/prior Treatment) HPI Comments: Patient here complaining of bloody diarrhea which began today with associated left lower quadrant abdominal pain. Denies any prior history of diverticular disease. No fever or chills. Did not emesis 1 which is not bloody. She does take brilinta which is a blood thinner. Denies any weakness with standing. Went to her physician's office today and was sent here for further workup.  Patient is a 74 y.o. female presenting with diarrhea. The history is provided by the patient.  Diarrhea   Past Medical History  Diagnosis Date  . Complication of anesthesia     form of hepatitis and ran a fever"  . Coronary artery disease   . Anginal pain   . Hypertension   . Anxiety   . Shortness of breath   . Diabetes mellitus     diet controled  . GERD (gastroesophageal reflux disease)   . H/O hiatal hernia   . Fibromyalgia    Past Surgical History  Procedure Laterality Date  . Coronary angioplasty    . Rotator cuff repair    . Abdominal hysterectomy    . Tonsillectomy    . Dilation and curettage of uterus    . Appendectomy    . Left heart catheterization with coronary angiogram N/A 09/22/2011    Procedure: LEFT HEART CATHETERIZATION WITH CORONARY ANGIOGRAM;  Surgeon: Robynn Pane, MD;  Location: [facility] CATH LAB;  Service: Cardiovascular;  Laterality: N/A;  . Percutaneous coronary stent intervention (pci-s)  09/22/2011    Procedure: PERCUTANEOUS CORONARY STENT INTERVENTION (PCI-S);  Surgeon: Robynn Pane, MD;  Location: [facility] CATH LAB;  Service: Cardiovascular;;   History reviewed. No pertinent family history. History  Substance Use Topics  . Smoking status: Former Smoker -- 41 years    Types:  Cigarettes    Quit date: 09/22/2011  . Smokeless tobacco: Never Used  . Alcohol Use: No   OB History    No data available     Review of Systems  Gastrointestinal: Positive for diarrhea.  All other systems reviewed and are negative.     Allergies  Acetaminophen; Sulfa antibiotics; and Codeine  Home Medications   Prior to Admission medications   Medication Sig Start Date End Date Taking? Authorizing Provider  aspirin 325 MG EC tablet Take 325 mg by mouth daily.   Yes Historical Provider, MD  lisinopril (PRINIVIL) 10 MG tablet Take 1 tablet (10 mg total) by mouth daily. 09/23/11 09/18/14 Yes Rinaldo Cloud, MD  lisinopril (PRINIVIL,ZESTRIL) 20 MG tablet Take 20 mg by mouth 2 (two) times daily.   Yes Historical Provider, MD  metFORMIN (GLUCOPHAGE-XR) 500 MG 24 hr tablet Take 500 mg by mouth 2 (two) times daily.   Yes Historical Provider, MD  metoprolol succinate (TOPROL-XL) 50 MG 24 hr tablet Take 50 mg by mouth daily. Take with or immediately following a meal.   Yes Historical Provider, MD  nitroGLYCERIN (NITROSTAT) 0.4 MG SL tablet Place 1 tablet (0.4 mg total) under the tongue every 5 (five) minutes x 3 doses as needed for chest pain. 09/23/11 09/18/14 Yes Rinaldo Cloud, MD  atorvastatin (LIPITOR) 80 MG tablet Take 1 tablet (80 mg total) by mouth daily at 6 PM. 09/23/11 09/22/12  Rinaldo Cloud, MD  pantoprazole (  PROTONIX) 40 MG tablet Take 1 tablet (40 mg total) by mouth daily at 6 (six) AM. 09/23/11 09/22/12  Rinaldo CloudMohan Harwani, MD  Ticagrelor (BRILINTA) 90 MG TABS tablet Take 1 tablet (90 mg total) by mouth 2 (two) times daily. Patient not taking: Reported on 09/18/2014 09/23/11   Rinaldo CloudMohan Harwani, MD  vitamin E 1000 UNIT capsule Take 1,000 Units by mouth daily.    Historical Provider, MD   BP 103/82 mmHg  Pulse 98  Temp(Src) 97.6 F (36.4 C) (Oral)  Resp 18  SpO2 92% Physical Exam  Constitutional: She is oriented to person, place, and time. She appears well-developed and well-nourished.   Non-toxic appearance. No distress.  HENT:  Head: Normocephalic and atraumatic.  Eyes: Conjunctivae, EOM and lids are normal. Pupils are equal, round, and reactive to light.  Neck: Normal range of motion. Neck supple. No tracheal deviation present. No thyroid mass present.  Cardiovascular: Normal rate, regular rhythm and normal heart sounds.  Exam reveals no gallop.   No murmur heard. Pulmonary/Chest: Effort normal and breath sounds normal. No stridor. No respiratory distress. She has no decreased breath sounds. She has no wheezes. She has no rhonchi. She has no rales.  Abdominal: Soft. Normal appearance and bowel sounds are normal. She exhibits no distension. There is no tenderness. There is no rebound and no CVA tenderness.    Genitourinary: Rectal exam shows no tenderness. Guaiac negative stool.  Musculoskeletal: Normal range of motion. She exhibits no edema or tenderness.  Neurological: She is alert and oriented to person, place, and time. She has normal strength. No cranial nerve deficit or sensory deficit. GCS eye subscore is 4. GCS verbal subscore is 5. GCS motor subscore is 6.  Skin: Skin is warm and dry. No abrasion and no rash noted.  Psychiatric: She has a normal mood and affect. Her speech is normal and behavior is normal.  Nursing note and vitals reviewed.   ED Course  Procedures (including critical care time) Labs Review Labs Reviewed  CBC WITH DIFFERENTIAL/PLATELET  COMPREHENSIVE METABOLIC PANEL  LIPASE, BLOOD  URINALYSIS, ROUTINE W REFLEX MICROSCOPIC (NOT AT [facility])  POC OCCULT BLOOD, ED  TYPE AND SCREEN    Imaging Review No results found.   EKG Interpretation None      MDM   Final diagnoses:  Abdominal pain    Patient started on Cipro and Flagyl for suspected colitis as well as a UTI. Patient was Hemoccult-negative. She does have impressive leukocytosis as well as some evidence of acute kidney injury. We'll contact her physician for admission    Lorre NickAnthony  Deryck Hippler, MD 09/18/14 2230

## 2014-09-18 NOTE — H&P (Signed)
Emily Palmer is an 74 y.o. female.   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 Also complains of lower back pain states had MRI recently which showed herniated disc and being followed by neurosurgery. Patient also complains of difficulty urination but denies any frequency of urination. CT of the abdomen done showed mild left-sided colitis and was also noted to have markedly elevated WBCs partly due to steroid-induced/sepsis. Patient was also noted to be dehydrated with acute renal injury. Patient presently denies any chest pain states occasionally gets chest pain off and on has not used any nitroglycerin. Denies shortness of breath. Denies palpitation lightheadedness or syncope denies PND orthopnea leg swelling.  Past Medical History  Diagnosis Date  . Complication of anesthesia     form of hepatitis and ran a fever"  . Coronary artery disease   . Anginal pain   . Hypertension   . Anxiety   . Shortness of breath   . Diabetes mellitus     diet controled  . GERD (gastroesophageal reflux disease)   . H/O hiatal hernia   . Fibromyalgia     Past Surgical History  Procedure Laterality Date  . Coronary angioplasty    . Rotator cuff repair    . Abdominal hysterectomy    . Tonsillectomy    . Dilation and curettage of uterus    .  Appendectomy    . Left heart catheterization with coronary angiogram N/A 09/22/2011    Procedure: LEFT HEART CATHETERIZATION WITH CORONARY ANGIOGRAM;  Surgeon: Clent Demark, MD;  Location: [facility] CATH LAB;  Service: Cardiovascular;  Laterality: N/A;  . Percutaneous coronary stent intervention (pci-s)  09/22/2011    Procedure: PERCUTANEOUS CORONARY STENT INTERVENTION (PCI-S);  Surgeon: Clent Demark, MD;  Location: [facility] CATH LAB;  Service: Cardiovascular;;    History reviewed. No pertinent family history. Social History:  reports that she quit smoking about 2 years ago. Her smoking use included Cigarettes. She quit after 41 years of use. She has never used smokeless tobacco. She reports that she does not drink alcohol or use illicit drugs.  Allergies:  Allergies  Allergen Reactions  . Acetaminophen Anaphylaxis  . Sulfa Antibiotics     fever  . Codeine Nausea Only     (Not in a hospital admission)  Results for orders placed or performed during the hospital encounter of 09/18/14 (from the past 48 hour(s))  CBC with Differential     Status: Abnormal   Collection Time: 09/18/14  5:14 PM  Result Value Ref Range   WBC 20.6 (H) 4.0 - 10.5 K/uL   RBC 5.28 (H) 3.87 - 5.11 MIL/uL   Hemoglobin 15.9 (H) 12.0 - 15.0 g/dL   HCT 45.6 36.0 - 46.0 %   MCV 86.4 78.0 - 100.0 fL   MCH 30.1 26.0 - 34.0 pg   MCHC 34.9 30.0 - 36.0 g/dL   RDW 13.0 11.5 - 15.5 %   Platelets 328 150 - 400 K/uL  Neutrophils Relative % 77 43 - 77 %   Neutro Abs 15.7 (H) 1.7 - 7.7 K/uL   Lymphocytes Relative 15 12 - 46 %   Lymphs Abs 3.2 0.7 - 4.0 K/uL   Monocytes Relative 7 3 - 12 %   Monocytes Absolute 1.5 (H) 0.1 - 1.0 K/uL   Eosinophils Relative 1 0 - 5 %   Eosinophils Absolute 0.2 0.0 - 0.7 K/uL   Basophils Relative 0 0 - 1 %   Basophils Absolute 0.0 0.0 - 0.1 K/uL  Comprehensive metabolic panel     Status: Abnormal   Collection Time: 09/18/14  5:14 PM  Result Value Ref Range   Sodium 135 135 - 145 mmol/L    Potassium 3.8 3.5 - 5.1 mmol/L   Chloride 96 (L) 101 - 111 mmol/L   CO2 25 22 - 32 mmol/L   Glucose, Bld 147 (H) 65 - 99 mg/dL   BUN 45 (H) 6 - 20 mg/dL   Creatinine, Ser 1.95 (H) 0.44 - 1.00 mg/dL   Calcium 10.0 8.9 - 10.3 mg/dL   Total Protein 8.0 6.5 - 8.1 g/dL   Albumin 4.4 3.5 - 5.0 g/dL   AST 35 15 - 41 U/L   ALT 50 14 - 54 U/L   Alkaline Phosphatase 56 38 - 126 U/L   Total Bilirubin 1.0 0.3 - 1.2 mg/dL   GFR calc non Af Amer 24 (L) >60 mL/min   GFR calc Af Amer 28 (L) >60 mL/min    Comment: (NOTE) The eGFR has been calculated using the CKD EPI equation. This calculation has not been validated in all clinical situations. eGFR's persistently <60 mL/min signify possible Chronic Kidney Disease.    Anion gap 14 5 - 15  Lipase, blood     Status: Abnormal   Collection Time: 09/18/14  5:14 PM  Result Value Ref Range   Lipase 60 (H) 22 - 51 U/L  Type and screen for Red Blood Exchange     Status: None   Collection Time: 09/18/14  5:14 PM  Result Value Ref Range   ABO/RH(D) A POS    Antibody Screen NEG    Sample Expiration 09/21/2014   POC occult blood, ED RN will collect     Status: None   Collection Time: 09/18/14  5:25 PM  Result Value Ref Range   Fecal Occult Bld NEGATIVE NEGATIVE  Urinalysis, Routine w reflex microscopic (not at [facility])     Status: Abnormal   Collection Time: 09/18/14  9:20 PM  Result Value Ref Range   Color, Urine YELLOW YELLOW   APPearance TURBID (A) CLEAR   Specific Gravity, Urine 1.007 1.005 - 1.030   pH 5.0 5.0 - 8.0   Glucose, UA NEGATIVE NEGATIVE mg/dL   Hgb urine dipstick MODERATE (A) NEGATIVE   Bilirubin Urine NEGATIVE NEGATIVE   Ketones, ur NEGATIVE NEGATIVE mg/dL   Protein, ur NEGATIVE NEGATIVE mg/dL   Urobilinogen, UA 0.2 0.0 - 1.0 mg/dL   Nitrite NEGATIVE NEGATIVE   Leukocytes, UA LARGE (A) NEGATIVE  Urine microscopic-add on     Status: Abnormal   Collection Time: 09/18/14  9:20 PM  Result Value Ref Range   Squamous Epithelial / LPF  FEW (A) RARE   WBC, UA TOO NUMEROUS TO COUNT <3 WBC/hpf   RBC / HPF 0-2 <3 RBC/hpf   Bacteria, UA MANY (A) RARE   Ct Abdomen Pelvis Wo Contrast  09/18/2014   CLINICAL DATA:  Acute onset of diarrhea, hematochezia, vomiting  and left lower quadrant abdominal pain, radiating to the right lower quadrant. Initial encounter.  EXAM: CT ABDOMEN AND PELVIS WITHOUT CONTRAST  TECHNIQUE: Multidetector CT imaging of the abdomen and pelvis was performed following the standard protocol without IV contrast.  COMPARISON:  Renal ultrasound performed 04/03/2008  FINDINGS: The visualized lung bases are clear. Diffuse coronary artery calcifications are seen.  A few calcified granulomata are seen about the liver and spleen. The liver and spleen are otherwise unremarkable. The gallbladder is within normal limits. The pancreas and adrenal glands are unremarkable.  The kidneys are unremarkable in appearance. There is no evidence of hydronephrosis. No renal or ureteral stones are seen. A likely vascular calcification is noted at the left renal hilum. No perinephric stranding is appreciated.  No free fluid is identified. The small bowel is unremarkable in appearance. The stomach is within normal limits. No acute vascular abnormalities are seen. Scattered calcification is noted along the abdominal aorta and its branches.  The patient is status post appendectomy. Scattered diverticulosis is noted along the transverse, descending and sigmoid colon, without definite evidence of diverticulitis.  Apparent very mild wall thickening along the sigmoid colon could be artifactual in nature, or could reflect a very mild infectious or inflammatory process, given the patient's symptoms.  The bladder is mildly distended and grossly unremarkable. The patient is status post hysterectomy. The right ovary is unremarkable. No suspicious adnexal masses are seen. No inguinal lymphadenopathy is seen.  No acute osseous abnormalities are identified.  IMPRESSION:  1. Apparent very mild thickening along the sigmoid colon could be artifactual in nature, or could reflect a very mild infectious or inflammatory process, given the patient's symptoms. 2. Scattered diverticulosis along the transverse, descending and sigmoid colon, without evidence of diverticulitis. 3. Diffuse coronary artery calcifications seen. 4. Scattered calcification along the abdominal aorta and its branches.   Electronically Signed   By: Garald Balding M.D.   On: 09/18/2014 21:26    Review of Systems  Constitutional: Positive for chills and malaise/fatigue. Negative for fever.  HENT: Negative for hearing loss.   Eyes: Negative for blurred vision and double vision.  Respiratory: Negative for cough, hemoptysis and sputum production.   Cardiovascular: Negative for chest pain, palpitations, orthopnea, leg swelling and PND.  Gastrointestinal: Positive for nausea, vomiting, abdominal pain and diarrhea.  Genitourinary: Positive for dysuria.  Neurological: Negative for dizziness and headaches.    Blood pressure 128/70, pulse 94, temperature 97.6 F (36.4 C), temperature source Oral, resp. rate 20, SpO2 97 %. Physical Exam  Constitutional: She is oriented to person, place, and time.  HENT:  Head: Normocephalic and atraumatic.  Eyes: Conjunctivae are normal. Pupils are equal, round, and reactive to light. Left eye exhibits no discharge. No scleral icterus.  Neck: Normal range of motion. Neck supple. No tracheal deviation present. No thyromegaly present.  Cardiovascular: Normal rate and regular rhythm.   Murmur (Soft systolic murmur noted no S3 gallop) heard. Respiratory: Effort normal and breath sounds normal. No respiratory distress. She has no rales.  GI: Soft. Bowel sounds are normal. She exhibits no distension and no mass. There is tenderness (left lower quadrant noted no guarding or rebound). There is no rebound and no guarding.  Musculoskeletal: She exhibits no edema or tenderness.   Neurological: She is alert and oriented to person, place, and time.     Assessment/Plan Acute colitis Probable UTI Dehydration Acute renal injury Coronary artery disease history of silent non-Q-wave MI in the past status post PCI to CTO  of left circumflex in the past Stable angina Hypertension Diabetes mellitus Hypercholesteremia Degenerative joint disease Lumbar radiculopathy History of tobacco abuse GERD History of hiatus hernia Anxiety disorder Plan As per orders  Charolette Forward 09/18/2014, 11:28 PM

## 2014-09-18 NOTE — ED Notes (Signed)
Pt reports 4 episodes of diarrhea today; pt reports brown in color surrounding by "jelly."

## 2014-09-18 NOTE — ED Notes (Signed)
Transported to ct 

## 2014-09-18 NOTE — ED Notes (Signed)
Pt does not feel able to give a UA

## 2014-09-19 ENCOUNTER — Encounter (HOSPITAL_COMMUNITY): Payer: Self-pay | Admitting: *Deleted

## 2014-09-19 LAB — GLUCOSE, CAPILLARY
GLUCOSE-CAPILLARY: 104 mg/dL — AB (ref 65–99)
GLUCOSE-CAPILLARY: 120 mg/dL — AB (ref 65–99)
GLUCOSE-CAPILLARY: 184 mg/dL — AB (ref 65–99)
Glucose-Capillary: 234 mg/dL — ABNORMAL HIGH (ref 65–99)

## 2014-09-19 LAB — BASIC METABOLIC PANEL
ANION GAP: 7 (ref 5–15)
BUN: 29 mg/dL — ABNORMAL HIGH (ref 6–20)
CHLORIDE: 105 mmol/L (ref 101–111)
CO2: 24 mmol/L (ref 22–32)
CREATININE: 1.2 mg/dL — AB (ref 0.44–1.00)
Calcium: 8.4 mg/dL — ABNORMAL LOW (ref 8.9–10.3)
GFR calc non Af Amer: 44 mL/min — ABNORMAL LOW (ref >60)
GFR, EST AFRICAN AMERICAN: 51 mL/min — AB (ref >60)
Glucose, Bld: 119 mg/dL — ABNORMAL HIGH (ref 65–99)
POTASSIUM: 3.3 mmol/L — AB (ref 3.5–5.1)
SODIUM: 136 mmol/L (ref 135–145)

## 2014-09-19 LAB — CBC
HCT: 36.9 % (ref 36.0–46.0)
Hemoglobin: 12.1 g/dL (ref 12.0–15.0)
MCH: 28.3 pg (ref 26.0–34.0)
MCHC: 32.8 g/dL (ref 30.0–36.0)
MCV: 86.4 fL (ref 78.0–100.0)
PLATELETS: 199 10*3/uL (ref 150–400)
RBC: 4.27 MIL/uL (ref 3.87–5.11)
RDW: 12.9 % (ref 11.5–15.5)
WBC: 13.2 10*3/uL — ABNORMAL HIGH (ref 4.0–10.5)

## 2014-09-19 LAB — URINALYSIS, ROUTINE W REFLEX MICROSCOPIC
BILIRUBIN URINE: NEGATIVE
Glucose, UA: NEGATIVE mg/dL
HGB URINE DIPSTICK: NEGATIVE
Ketones, ur: NEGATIVE mg/dL
Nitrite: NEGATIVE
Protein, ur: NEGATIVE mg/dL
SPECIFIC GRAVITY, URINE: 1.006 (ref 1.005–1.030)
Urobilinogen, UA: 0.2 mg/dL (ref 0.0–1.0)
pH: 5 (ref 5.0–8.0)

## 2014-09-19 LAB — URINE MICROSCOPIC-ADD ON

## 2014-09-19 LAB — ABO/RH: ABO/RH(D): A POS

## 2014-09-19 MED ORDER — ONDANSETRON HCL 4 MG/2ML IJ SOLN
4.0000 mg | Freq: Three times a day (TID) | INTRAMUSCULAR | Status: DC | PRN
  Administered 2014-09-20 – 2014-09-22 (×4): 4 mg via INTRAVENOUS
  Filled 2014-09-19 (×4): qty 2

## 2014-09-19 MED ORDER — INSULIN ASPART 100 UNIT/ML ~~LOC~~ SOLN
0.0000 [IU] | Freq: Three times a day (TID) | SUBCUTANEOUS | Status: DC
  Administered 2014-09-19: 3 [IU] via SUBCUTANEOUS
  Administered 2014-09-20: 1 [IU] via SUBCUTANEOUS
  Administered 2014-09-20 (×2): 2 [IU] via SUBCUTANEOUS
  Administered 2014-09-21 (×2): 1 [IU] via SUBCUTANEOUS
  Administered 2014-09-21 – 2014-09-22 (×2): 2 [IU] via SUBCUTANEOUS
  Administered 2014-09-22: 3 [IU] via SUBCUTANEOUS

## 2014-09-19 MED ORDER — POTASSIUM CHLORIDE ER 10 MEQ PO TBCR
10.0000 meq | EXTENDED_RELEASE_TABLET | Freq: Every day | ORAL | Status: DC
  Administered 2014-09-19 – 2014-09-21 (×3): 10 meq via ORAL
  Filled 2014-09-19 (×6): qty 1

## 2014-09-19 MED ORDER — HEPARIN SODIUM (PORCINE) 5000 UNIT/ML IJ SOLN
5000.0000 [IU] | Freq: Three times a day (TID) | INTRAMUSCULAR | Status: DC
  Administered 2014-09-19 – 2014-09-22 (×10): 5000 [IU] via SUBCUTANEOUS
  Filled 2014-09-19 (×10): qty 1

## 2014-09-19 MED ORDER — METRONIDAZOLE IN NACL 5-0.79 MG/ML-% IV SOLN
500.0000 mg | Freq: Four times a day (QID) | INTRAVENOUS | Status: DC
  Administered 2014-09-19 – 2014-09-21 (×10): 500 mg via INTRAVENOUS
  Filled 2014-09-19 (×10): qty 100

## 2014-09-19 MED ORDER — SODIUM CHLORIDE 0.9 % IV SOLN
INTRAVENOUS | Status: DC
  Administered 2014-09-19 – 2014-09-20 (×4): via INTRAVENOUS

## 2014-09-19 MED ORDER — NITROGLYCERIN 0.4 MG SL SUBL: 0.4000 mg | SUBLINGUAL_TABLET | SUBLINGUAL | Status: DC | PRN

## 2014-09-19 MED ORDER — OXYCODONE HCL 5 MG PO TABS
5.0000 mg | ORAL_TABLET | Freq: Three times a day (TID) | ORAL | Status: DC | PRN
  Administered 2014-09-19 (×2): 5 mg via ORAL
  Filled 2014-09-19 (×2): qty 1

## 2014-09-19 MED ORDER — METOPROLOL SUCCINATE ER 50 MG PO TB24
50.0000 mg | ORAL_TABLET | Freq: Every day | ORAL | Status: DC
  Administered 2014-09-19 – 2014-09-21 (×4): 50 mg via ORAL
  Filled 2014-09-19 (×4): qty 1

## 2014-09-19 MED ORDER — ASPIRIN EC 81 MG PO TBEC
81.0000 mg | DELAYED_RELEASE_TABLET | Freq: Every day | ORAL | Status: DC
  Administered 2014-09-19 – 2014-09-22 (×4): 81 mg via ORAL
  Filled 2014-09-19 (×4): qty 1

## 2014-09-19 MED ORDER — OXYCODONE HCL 5 MG PO TABS
5.0000 mg | ORAL_TABLET | Freq: Four times a day (QID) | ORAL | Status: DC | PRN
  Administered 2014-09-19 – 2014-09-22 (×8): 5 mg via ORAL
  Filled 2014-09-19 (×9): qty 1

## 2014-09-19 MED ORDER — CIPROFLOXACIN IN D5W 400 MG/200ML IV SOLN: 400.0000 mg | Freq: Every day | INTRAVENOUS | Status: DC

## 2014-09-19 MED ORDER — PANTOPRAZOLE SODIUM 40 MG PO TBEC
40.0000 mg | DELAYED_RELEASE_TABLET | Freq: Every day | ORAL | Status: DC
  Administered 2014-09-19 – 2014-09-22 (×4): 40 mg via ORAL
  Filled 2014-09-19 (×4): qty 1

## 2014-09-19 MED ORDER — SODIUM CHLORIDE 0.9 % IJ SOLN
3.0000 mL | Freq: Two times a day (BID) | INTRAMUSCULAR | Status: DC
  Administered 2014-09-21 – 2014-09-22 (×3): 3 mL via INTRAVENOUS

## 2014-09-19 MED ORDER — CIPROFLOXACIN IN D5W 400 MG/200ML IV SOLN
400.0000 mg | Freq: Two times a day (BID) | INTRAVENOUS | Status: DC
  Administered 2014-09-19 – 2014-09-21 (×5): 400 mg via INTRAVENOUS
  Filled 2014-09-19 (×5): qty 200

## 2014-09-19 NOTE — Consult Note (Signed)
Reason for Consult:Back and Left leg pain. Referring Physician: DR.Harwani  Emily Palmer is an 74 y.o. female.  HPI: Patient seen by me in our office yesterday with a Positive MRI for a Herniated Lumbar Disc. She had a three day history of vomiting and Diarrhea and appeared dehydrated. I sent her to the ER for admission by her MD. She will later need an Outpatient Myelogram to evaluate her Left Leg pain. Her BUN and Creatine needs to be stabilizesdnd her medical condition needs to be improved first.  Past Medical History  Diagnosis Date  . Complication of anesthesia     form of hepatitis and ran a fever"  . Coronary artery disease   . Anginal pain   . Hypertension   . Anxiety   . Shortness of breath   . Diabetes mellitus     diet controled  . GERD (gastroesophageal reflux disease)   . H/O hiatal hernia   . Fibromyalgia     Past Surgical History  Procedure Laterality Date  . Coronary angioplasty    . Rotator cuff repair    . Abdominal hysterectomy    . Tonsillectomy    . Dilation and curettage of uterus    . Appendectomy    . Left heart catheterization with coronary angiogram N/A 09/22/2011    Procedure: LEFT HEART CATHETERIZATION WITH CORONARY ANGIOGRAM;  Surgeon: Clent Demark, MD;  Location: [facility] CATH LAB;  Service: Cardiovascular;  Laterality: N/A;  . Percutaneous coronary stent intervention (pci-s)  09/22/2011    Procedure: PERCUTANEOUS CORONARY STENT INTERVENTION (PCI-S);  Surgeon: Clent Demark, MD;  Location: [facility] CATH LAB;  Service: Cardiovascular;;    History reviewed. No pertinent family history.  Social History:  reports that she quit smoking about 2 years ago. Her smoking use included Cigarettes. She quit after 41 years of use. She has never used smokeless tobacco. She reports that she does not drink alcohol or use illicit drugs.  Allergies:  Allergies  Allergen Reactions  . Acetaminophen Anaphylaxis  . Sulfa Antibiotics     fever  . Codeine Nausea Only     Medications: I have reviewed the patient's current medications.  Results for orders placed or performed during the hospital encounter of 09/18/14 (from the past 48 hour(s))  ABO/Rh     Status: None   Collection Time: 09/18/14  5:10 PM  Result Value Ref Range   ABO/RH(D) A POS   CBC with Differential     Status: Abnormal   Collection Time: 09/18/14  5:14 PM  Result Value Ref Range   WBC 20.6 (H) 4.0 - 10.5 K/uL   RBC 5.28 (H) 3.87 - 5.11 MIL/uL   Hemoglobin 15.9 (H) 12.0 - 15.0 g/dL   HCT 45.6 36.0 - 46.0 %   MCV 86.4 78.0 - 100.0 fL   MCH 30.1 26.0 - 34.0 pg   MCHC 34.9 30.0 - 36.0 g/dL   RDW 13.0 11.5 - 15.5 %   Platelets 328 150 - 400 K/uL   Neutrophils Relative % 77 43 - 77 %   Neutro Abs 15.7 (H) 1.7 - 7.7 K/uL   Lymphocytes Relative 15 12 - 46 %   Lymphs Abs 3.2 0.7 - 4.0 K/uL   Monocytes Relative 7 3 - 12 %   Monocytes Absolute 1.5 (H) 0.1 - 1.0 K/uL   Eosinophils Relative 1 0 - 5 %   Eosinophils Absolute 0.2 0.0 - 0.7 K/uL   Basophils Relative 0 0 - 1 %  Basophils Absolute 0.0 0.0 - 0.1 K/uL  Comprehensive metabolic panel     Status: Abnormal   Collection Time: 09/18/14  5:14 PM  Result Value Ref Range   Sodium 135 135 - 145 mmol/L   Potassium 3.8 3.5 - 5.1 mmol/L   Chloride 96 (L) 101 - 111 mmol/L   CO2 25 22 - 32 mmol/L   Glucose, Bld 147 (H) 65 - 99 mg/dL   BUN 45 (H) 6 - 20 mg/dL   Creatinine, Ser 1.95 (H) 0.44 - 1.00 mg/dL   Calcium 10.0 8.9 - 10.3 mg/dL   Total Protein 8.0 6.5 - 8.1 g/dL   Albumin 4.4 3.5 - 5.0 g/dL   AST 35 15 - 41 U/L   ALT 50 14 - 54 U/L   Alkaline Phosphatase 56 38 - 126 U/L   Total Bilirubin 1.0 0.3 - 1.2 mg/dL   GFR calc non Af Amer 24 (L) >60 mL/min   GFR calc Af Amer 28 (L) >60 mL/min    Comment: (NOTE) The eGFR has been calculated using the CKD EPI equation. This calculation has not been validated in all clinical situations. eGFR's persistently <60 mL/min signify possible Chronic Kidney Disease.    Anion gap 14 5 - 15   Lipase, blood     Status: Abnormal   Collection Time: 09/18/14  5:14 PM  Result Value Ref Range   Lipase 60 (H) 22 - 51 U/L  Type and screen for Red Blood Exchange     Status: None   Collection Time: 09/18/14  5:14 PM  Result Value Ref Range   ABO/RH(D) A POS    Antibody Screen NEG    Sample Expiration 09/21/2014   POC occult blood, ED RN will collect     Status: None   Collection Time: 09/18/14  5:25 PM  Result Value Ref Range   Fecal Occult Bld NEGATIVE NEGATIVE  Urinalysis, Routine w reflex microscopic (not at [facility])     Status: Abnormal   Collection Time: 09/18/14  9:20 PM  Result Value Ref Range   Color, Urine YELLOW YELLOW   APPearance TURBID (A) CLEAR   Specific Gravity, Urine 1.007 1.005 - 1.030   pH 5.0 5.0 - 8.0   Glucose, UA NEGATIVE NEGATIVE mg/dL   Hgb urine dipstick MODERATE (A) NEGATIVE   Bilirubin Urine NEGATIVE NEGATIVE   Ketones, ur NEGATIVE NEGATIVE mg/dL   Protein, ur NEGATIVE NEGATIVE mg/dL   Urobilinogen, UA 0.2 0.0 - 1.0 mg/dL   Nitrite NEGATIVE NEGATIVE   Leukocytes, UA LARGE (A) NEGATIVE  Urine microscopic-add on     Status: Abnormal   Collection Time: 09/18/14  9:20 PM  Result Value Ref Range   Squamous Epithelial / LPF FEW (A) RARE   WBC, UA TOO NUMEROUS TO COUNT <3 WBC/hpf   RBC / HPF 0-2 <3 RBC/hpf   Bacteria, UA MANY (A) RARE  Urinalysis, Routine w reflex microscopic (not at [facility])     Status: Abnormal   Collection Time: 09/19/14 12:54 AM  Result Value Ref Range   Color, Urine YELLOW YELLOW   APPearance CLEAR CLEAR   Specific Gravity, Urine 1.006 1.005 - 1.030   pH 5.0 5.0 - 8.0   Glucose, UA NEGATIVE NEGATIVE mg/dL   Hgb urine dipstick NEGATIVE NEGATIVE   Bilirubin Urine NEGATIVE NEGATIVE   Ketones, ur NEGATIVE NEGATIVE mg/dL   Protein, ur NEGATIVE NEGATIVE mg/dL   Urobilinogen, UA 0.2 0.0 - 1.0 mg/dL   Nitrite NEGATIVE NEGATIVE   Leukocytes,  UA MODERATE (A) NEGATIVE  Urine microscopic-add on     Status: None   Collection Time:  09/19/14 12:54 AM  Result Value Ref Range   Squamous Epithelial / LPF RARE RARE   WBC, UA 11-20 <3 WBC/hpf   Bacteria, UA RARE RARE  Basic metabolic panel     Status: Abnormal   Collection Time: 09/19/14  5:05 AM  Result Value Ref Range   Sodium 136 135 - 145 mmol/L   Potassium 3.3 (L) 3.5 - 5.1 mmol/L   Chloride 105 101 - 111 mmol/L   CO2 24 22 - 32 mmol/L   Glucose, Bld 119 (H) 65 - 99 mg/dL   BUN 29 (H) 6 - 20 mg/dL   Creatinine, Ser 1.20 (H) 0.44 - 1.00 mg/dL   Calcium 8.4 (L) 8.9 - 10.3 mg/dL   GFR calc non Af Amer 44 (L) >60 mL/min   GFR calc Af Amer 51 (L) >60 mL/min    Comment: (NOTE) The eGFR has been calculated using the CKD EPI equation. This calculation has not been validated in all clinical situations. eGFR's persistently <60 mL/min signify possible Chronic Kidney Disease.    Anion gap 7 5 - 15  CBC     Status: Abnormal   Collection Time: 09/19/14  5:05 AM  Result Value Ref Range   WBC 13.2 (H) 4.0 - 10.5 K/uL   RBC 4.27 3.87 - 5.11 MIL/uL   Hemoglobin 12.1 12.0 - 15.0 g/dL    Comment: DELTA CHECK NOTED REPEATED TO VERIFY    HCT 36.9 36.0 - 46.0 %   MCV 86.4 78.0 - 100.0 fL   MCH 28.3 26.0 - 34.0 pg   MCHC 32.8 30.0 - 36.0 g/dL   RDW 12.9 11.5 - 15.5 %   Platelets 199 150 - 400 K/uL    Ct Abdomen Pelvis Wo Contrast  09/18/2014   CLINICAL DATA:  Acute onset of diarrhea, hematochezia, vomiting and left lower quadrant abdominal pain, radiating to the right lower quadrant. Initial encounter.  EXAM: CT ABDOMEN AND PELVIS WITHOUT CONTRAST  TECHNIQUE: Multidetector CT imaging of the abdomen and pelvis was performed following the standard protocol without IV contrast.  COMPARISON:  Renal ultrasound performed 04/03/2008  FINDINGS: The visualized lung bases are clear. Diffuse coronary artery calcifications are seen.  A few calcified granulomata are seen about the liver and spleen. The liver and spleen are otherwise unremarkable. The gallbladder is within normal limits.  The pancreas and adrenal glands are unremarkable.  The kidneys are unremarkable in appearance. There is no evidence of hydronephrosis. No renal or ureteral stones are seen. A likely vascular calcification is noted at the left renal hilum. No perinephric stranding is appreciated.  No free fluid is identified. The small bowel is unremarkable in appearance. The stomach is within normal limits. No acute vascular abnormalities are seen. Scattered calcification is noted along the abdominal aorta and its branches.  The patient is status post appendectomy. Scattered diverticulosis is noted along the transverse, descending and sigmoid colon, without definite evidence of diverticulitis.  Apparent very mild wall thickening along the sigmoid colon could be artifactual in nature, or could reflect a very mild infectious or inflammatory process, given the patient's symptoms.  The bladder is mildly distended and grossly unremarkable. The patient is status post hysterectomy. The right ovary is unremarkable. No suspicious adnexal masses are seen. No inguinal lymphadenopathy is seen.  No acute osseous abnormalities are identified.  IMPRESSION: 1. Apparent very mild thickening along the sigmoid colon  could be artifactual in nature, or could reflect a very mild infectious or inflammatory process, given the patient's symptoms. 2. Scattered diverticulosis along the transverse, descending and sigmoid colon, without evidence of diverticulitis. 3. Diffuse coronary artery calcifications seen. 4. Scattered calcification along the abdominal aorta and its branches.   Electronically Signed   By: Garald Balding M.D.   On: 09/18/2014 21:26    Review of Systems  Constitutional: Positive for weight loss and malaise/fatigue.  HENT: Negative.   Eyes: Negative.   Respiratory: Negative.   Cardiovascular: Negative.   Gastrointestinal: Positive for nausea, vomiting, abdominal pain, diarrhea and blood in stool.  Genitourinary: Negative.    Musculoskeletal: Positive for back pain and joint pain.  Skin: Negative.   Neurological: Positive for focal weakness and weakness.  Endo/Heme/Allergies: Negative.   Psychiatric/Behavioral: Negative.    Blood pressure 121/63, pulse 80, temperature 98.3 F (36.8 C), temperature source Oral, resp. rate 20, height _0  (1.676 m), weight 82.146 kg (181 lb 1.6 oz), SpO2 95 %. Physical Exam  Constitutional: She is oriented to person, place, and time. She appears distressed.  HENT:  Head: Normocephalic.  Eyes: Pupils are equal, round, and reactive to light.  Neck: Normal range of motion.  Cardiovascular: Normal rate.   Respiratory: Effort normal.  GI: There is tenderness.  Musculoskeletal:  Positive straight leg raising on the Left. Good motor strength but pain in entire lower.  Neurological: She is oriented to person, place, and time.    Assessment/Plan: Will follow in the office as discussed.  Pancho Rushing A 09/19/2014, 7:20 AM

## 2014-09-19 NOTE — Progress Notes (Signed)
Subjective:  Denies any chest pain or shortness of breath states abdominal pain has improved diarrhea and nausea vomiting is resolved. White count trending down and renal function improving  Objective:  Vital Signs in the last 24 hours: Temp:  [97.6 F (36.4 C)-98.3 F (36.8 C)] 98.3 F (36.8 C) (07/08 0536) Pulse Rate:  [80-99] 80 (07/08 0536) Resp:  [15-20] 20 (07/08 0536) BP: (88-141)/(53-97) 121/63 mmHg (07/08 0536) SpO2:  [92 %-99 %] 95 % (07/08 0536) Weight:  [82.146 kg (181 lb 1.6 oz)] 82.146 kg (181 lb 1.6 oz) (07/08 0052)  Intake/Output from previous day: 07/07 0701 - 07/08 0700 In: 677.1 [I.V.:477.1; IV Piggyback:200] Out: 1065 [Urine:1050; Emesis/NG output:15] Intake/Output from this shift: Total I/O In: 480 [P.O.:480] Out: -   Physical Exam: Neck: no adenopathy, no carotid bruit, no JVD and supple, symmetrical, trachea midline Lungs: clear to auscultation bilaterally Heart: regular rate and rhythm, S1, S2 normal and soft systolic murmur noted no S3 gallop Abdomen: Soft bowel sounds present mild left lower quadrant tenderness noted no guarding Extremities: extremities normal, atraumatic, no cyanosis or edema  Lab Results:  Recent Labs  09/18/14 1714 09/19/14 0505  WBC 20.6* 13.2*  HGB 15.9* 12.1  PLT 328 199    Recent Labs  09/18/14 1714 09/19/14 0505  NA 135 136  K 3.8 3.3*  CL 96* 105  CO2 25 24  GLUCOSE 147* 119*  BUN 45* 29*  CREATININE 1.95* 1.20*   No results for input(s): TROPONINI in the last 72 hours.  Invalid input(s): CK, MB Hepatic Function Panel  Recent Labs  09/18/14 1714  PROT 8.0  ALBUMIN 4.4  AST 35  ALT 50  ALKPHOS 56  BILITOT 1.0   No results for input(s): CHOL in the last 72 hours. No results for input(s): PROTIME in the last 72 hours.  Imaging: Imaging results have been reviewed and Ct Abdomen Pelvis Wo Contrast  09/18/2014   CLINICAL DATA:  Acute onset of diarrhea, hematochezia, vomiting and left lower quadrant  abdominal pain, radiating to the right lower quadrant. Initial encounter.  EXAM: CT ABDOMEN AND PELVIS WITHOUT CONTRAST  TECHNIQUE: Multidetector CT imaging of the abdomen and pelvis was performed following the standard protocol without IV contrast.  COMPARISON:  Renal ultrasound performed 04/03/2008  FINDINGS: The visualized lung bases are clear. Diffuse coronary artery calcifications are seen.  A few calcified granulomata are seen about the liver and spleen. The liver and spleen are otherwise unremarkable. The gallbladder is within normal limits. The pancreas and adrenal glands are unremarkable.  The kidneys are unremarkable in appearance. There is no evidence of hydronephrosis. No renal or ureteral stones are seen. A likely vascular calcification is noted at the left renal hilum. No perinephric stranding is appreciated.  No free fluid is identified. The small bowel is unremarkable in appearance. The stomach is within normal limits. No acute vascular abnormalities are seen. Scattered calcification is noted along the abdominal aorta and its branches.  The patient is status post appendectomy. Scattered diverticulosis is noted along the transverse, descending and sigmoid colon, without definite evidence of diverticulitis.  Apparent very mild wall thickening along the sigmoid colon could be artifactual in nature, or could reflect a very mild infectious or inflammatory process, given the patient's symptoms.  The bladder is mildly distended and grossly unremarkable. The patient is status post hysterectomy. The right ovary is unremarkable. No suspicious adnexal masses are seen. No inguinal lymphadenopathy is seen.  No acute osseous abnormalities are identified.  IMPRESSION:  1. Apparent very mild thickening along the sigmoid colon could be artifactual in nature, or could reflect a very mild infectious or inflammatory process, given the patient's symptoms. 2. Scattered diverticulosis along the transverse, descending and  sigmoid colon, without evidence of diverticulitis. 3. Diffuse coronary artery calcifications seen. 4. Scattered calcification along the abdominal aorta and its branches.   Electronically Signed   By: Roanna RaiderJeffery  Chang M.D.   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 Check cultures  LOS: 1 day    Rinaldo CloudHarwani, Deara Bober 09/19/2014, 12:28 PM

## 2014-09-19 NOTE — Progress Notes (Signed)
ANTIBIOTIC CONSULT NOTE   Pharmacy Consult for cipro Indication: Acute colitis/UTI  Allergies  Allergen Reactions  . Acetaminophen Anaphylaxis  . Sulfa Antibiotics     fever  . Codeine Nausea Only    Patient Measurements: Height: 5\' 6"  (167.6 cm) Weight: 181 lb 1.6 oz (82.146 kg) IBW/kg (Calculated) : 59.3 Adjusted Body Weight:   Vital Signs: Temp: 98.3 F (36.8 C) (07/08 0536) Temp Source: Oral (07/08 0536) BP: 121/63 mmHg (07/08 0536) Pulse Rate: 80 (07/08 0536) Intake/Output from previous day: 07/07 0701 - 07/08 0700 In: 677.1 [I.V.:477.1; IV Piggyback:200] Out: 1065 [Urine:1050; Emesis/NG output:15] Intake/Output from this shift: Total I/O In: 480 [P.O.:480] Out: -   Labs:  Recent Labs  09/18/14 1714 09/19/14 0505  WBC 20.6* 13.2*  HGB 15.9* 12.1  PLT 328 199  CREATININE 1.95* 1.20*   Estimated Creatinine Clearance: 45.8 mL/min (by C-G formula based on Cr of 1.2). No results for input(s): VANCOTROUGH, VANCOPEAK, VANCORANDOM, GENTTROUGH, GENTPEAK, GENTRANDOM, TOBRATROUGH, TOBRAPEAK, TOBRARND, AMIKACINPEAK, AMIKACINTROU, AMIKACIN in the last 72 hours.   Microbiology: No results found for this or any previous visit (from the past 720 hour(s)).  Medications:  Anti-infectives    Start     Dose/Rate Route Frequency Ordered Stop   09/19/14 2200  ciprofloxacin (CIPRO) IVPB 400 mg     400 mg 200 mL/hr over 60 Minutes Intravenous Daily at bedtime 09/19/14 0218     09/19/14 0600  metroNIDAZOLE (FLAGYL) IVPB 500 mg     500 mg 100 mL/hr over 60 Minutes Intravenous 4 times per day 09/19/14 0037     09/18/14 2215  cefTRIAXone (ROCEPHIN) 1 g in dextrose 5 % 50 mL IVPB  Status:  Discontinued     1 g 100 mL/hr over 30 Minutes Intravenous Every 24 hours 09/18/14 2212 09/18/14 2213   09/18/14 2215  metroNIDAZOLE (FLAGYL) IVPB 500 mg     500 mg 100 mL/hr over 60 Minutes Intravenous  Once 09/18/14 2212 09/19/14 0211   09/18/14 2215  ciprofloxacin (CIPRO) IVPB 400 mg      400 mg 200 mL/hr over 60 Minutes Intravenous  Once 09/18/14 2213 09/18/14 2344     Assessment: 74 yo with left lower quadrant abdominal pain associated with nausea vomiting and diarrhea for last 2-3 days.  Patient's renal function improved from admission labs.  Ciprofloxacin and metronidazole started for colitis and possible UTI  7/7 >>cipro >> 7/7 >> flagyl >>  7/8 urine: pending (+pyuria)  WBC: improved Renal: improved CrCl now > 7130ml/min afebrile  Goal of Therapy:  Cipro dosed based on patient weight and renal function  Plan:  Day #1 cipro  Change Cipro to 400mg  IV q12h for improved renal function  Follow renal function and culture results  Juliette Alcideustin Zeigler, PharmD, BCPS.   Pager: [phone] 09/19/2014,10:33 AM

## 2014-09-19 NOTE — Care Management Note (Signed)
Case Management Note  Patient Details  Name: Emily Palmer MRN: [id number] Date of Birth: 12/22/1940  Subjective/Objective: Pt admitted with cco Left lower quadrant abdominal pain associated with nausea vomiting and diarrhea for last 2-3 days                  Action/Plan:  From home/plan to return home.   Expected Discharge Date:                  Expected Discharge Plan:  Home/Self Care  In-House Referral:     Discharge planning Services  CM Consult  Post Acute Care Choice:    Choice offered to:     DME Arranged:    DME Agency:     HH Arranged:    HH Agency:     Status of Service:  In process, will continue to follow  Medicare Important Message Given:    Date Medicare IM Given:    Medicare IM give by:    Date Additional Medicare IM Given:    Additional Medicare Important Message give by:     If discussed at Long Length of Stay Meetings, dates discussed:    Additional CommentsGeni Bers:  Askari Kinley, RN 09/19/2014, 12:49 PM

## 2014-09-19 NOTE — Progress Notes (Signed)
ANTIBIOTIC CONSULT NOTE - INITIAL  Pharmacy Consult for cipro Indication: Acute colitis  Allergies  Allergen Reactions  . Acetaminophen Anaphylaxis  . Sulfa Antibiotics     fever  . Codeine Nausea Only    Patient Measurements: Height: 5\' 6"  (167.6 cm) Weight: 181 lb 1.6 oz (82.146 kg) IBW/kg (Calculated) : 59.3 Adjusted Body Weight:   Vital Signs: Temp: 98 F (36.7 C) (07/08 0044) Temp Source: Oral (07/08 0044) BP: 118/69 mmHg (07/08 0044) Pulse Rate: 99 (07/08 0044) Intake/Output from previous day: 07/07 0701 - 07/08 0700 In: -  Out: 665 [Urine:650; Emesis/NG output:15] Intake/Output from this shift: Total I/O In: -  Out: 665 [Urine:650; Emesis/NG output:15]  Labs:  Recent Labs  09/18/14 1714  WBC 20.6*  HGB 15.9*  PLT 328  CREATININE 1.95*   Estimated Creatinine Clearance: 28.2 mL/min (by C-G formula based on Cr of 1.95). No results for input(s): VANCOTROUGH, VANCOPEAK, VANCORANDOM, GENTTROUGH, GENTPEAK, GENTRANDOM, TOBRATROUGH, TOBRAPEAK, TOBRARND, AMIKACINPEAK, AMIKACINTROU, AMIKACIN in the last 72 hours.   Microbiology: No results found for this or any previous visit (from the past 720 hour(s)).  Medical History: Past Medical History  Diagnosis Date  . Complication of anesthesia     form of hepatitis and ran a fever"  . Coronary artery disease   . Anginal pain   . Hypertension   . Anxiety   . Shortness of breath   . Diabetes mellitus     diet controled  . GERD (gastroesophageal reflux disease)   . H/O hiatal hernia   . Fibromyalgia     Medications:  Anti-infectives    Start     Dose/Rate Route Frequency Ordered Stop   09/19/14 2200  ciprofloxacin (CIPRO) IVPB 400 mg     400 mg 200 mL/hr over 60 Minutes Intravenous Daily at bedtime 09/19/14 0218     09/19/14 0600  metroNIDAZOLE (FLAGYL) IVPB 500 mg     500 mg 100 mL/hr over 60 Minutes Intravenous 4 times per day 09/19/14 0037     09/18/14 2215  cefTRIAXone (ROCEPHIN) 1 g in dextrose 5 %  50 mL IVPB  Status:  Discontinued     1 g 100 mL/hr over 30 Minutes Intravenous Every 24 hours 09/18/14 2212 09/18/14 2213   09/18/14 2215  metroNIDAZOLE (FLAGYL) IVPB 500 mg     500 mg 100 mL/hr over 60 Minutes Intravenous  Once 09/18/14 2212 09/19/14 0044   09/18/14 2215  ciprofloxacin (CIPRO) IVPB 400 mg     400 mg 200 mL/hr over 60 Minutes Intravenous  Once 09/18/14 2213 09/18/14 2344     Assessment: 74 yo with left lower quadrant abdominal pain associated with nausea vomiting and diarrhea for last 2-3 days.  Patient's renal function is very poor.  First dose of antibiotics already given.  Goal of Therapy:  Cipro dosed based on patient weight and renal function   Plan:  Follow up culture results  Cipro 400mg  iv q24hr  Aleene DavidsonGrimsley Jr, Sriram Febles Crowford 09/19/2014,2:23 AM

## 2014-09-20 LAB — GLUCOSE, CAPILLARY
GLUCOSE-CAPILLARY: 166 mg/dL — AB (ref 65–99)
GLUCOSE-CAPILLARY: 195 mg/dL — AB (ref 65–99)
GLUCOSE-CAPILLARY: 150 mg/dL — AB (ref 65–99)
Glucose-Capillary: 229 mg/dL — ABNORMAL HIGH (ref 65–99)

## 2014-09-20 LAB — CLOSTRIDIUM DIFFICILE BY PCR: CDIFFPCR: NEGATIVE

## 2014-09-20 LAB — BASIC METABOLIC PANEL WITH GFR
Anion gap: 7 (ref 5–15)
BUN: 17 mg/dL (ref 6–20)
CO2: 24 mmol/L (ref 22–32)
Calcium: 8.5 mg/dL — ABNORMAL LOW (ref 8.9–10.3)
Chloride: 103 mmol/L (ref 101–111)
Creatinine, Ser: 0.95 mg/dL (ref 0.44–1.00)
GFR calc Af Amer: >60 mL/min
GFR calc non Af Amer: 58 mL/min — ABNORMAL LOW
Glucose, Bld: 179 mg/dL — ABNORMAL HIGH (ref 65–99)
Potassium: 3.9 mmol/L (ref 3.5–5.1)
Sodium: 134 mmol/L — ABNORMAL LOW (ref 135–145)

## 2014-09-20 LAB — HEMOGLOBIN A1C
Hgb A1c MFr Bld: 7.8 % — ABNORMAL HIGH (ref 4.8–5.6)
Mean Plasma Glucose: 177 mg/dL

## 2014-09-20 LAB — CBC
HCT: 38 % (ref 36.0–46.0)
Hemoglobin: 12.6 g/dL (ref 12.0–15.0)
MCH: 29.2 pg (ref 26.0–34.0)
MCHC: 33.2 g/dL (ref 30.0–36.0)
MCV: 88.2 fL (ref 78.0–100.0)
PLATELETS: 184 10*3/uL (ref 150–400)
RBC: 4.31 MIL/uL (ref 3.87–5.11)
RDW: 13.1 % (ref 11.5–15.5)
WBC: 9 10*3/uL (ref 4.0–10.5)

## 2014-09-20 LAB — URINE CULTURE: Culture: NO GROWTH

## 2014-09-20 NOTE — Progress Notes (Signed)
Subjective:  Denies any chest pain or shortness of breath complains of occasional nausea and mild lower abdominal pain states overall feels good  Objective:  Vital Signs in the last 24 hours: Temp:  [98.3 F (36.8 C)-98.4 F (36.9 C)] 98.4 F (36.9 C) (07/09 0516) Pulse Rate:  [68-79] 68 (07/09 0516) Resp:  [20] 20 (07/09 0516) BP: (138-146)/(61-74) 138/74 mmHg (07/09 0516) SpO2:  [96 %-97 %] 96 % (07/09 0516) Weight:  [85.322 kg (188 lb 1.6 oz)] 85.322 kg (188 lb 1.6 oz) (07/09 0516)  Intake/Output from previous day: 07/08 0701 - 07/09 0700 In: 3182.5 [P.O.:1320; I.V.:1462.5; IV Piggyback:400] Out: 3000 [Urine:3000] Intake/Output from this shift: Total I/O In: -  Out: 400 [Urine:400]  Physical Exam: Neck: no adenopathy, no carotid bruit, no JVD and supple, symmetrical, trachea midline Lungs: clear to auscultation bilaterally Heart: regular rate and rhythm, S1, S2 normal and Soft systolic murmur noted Abdomen: soft, non-tender; bowel sounds normal; no masses,  no organomegaly Extremities: extremities normal, atraumatic, no cyanosis or edema  Lab Results:  Recent Labs  09/19/14 0505 09/20/14 0516  WBC 13.2* 9.0  HGB 12.1 12.6  PLT 199 184    Recent Labs  09/19/14 0505 09/20/14 0516  NA 136 134*  K 3.3* 3.9  CL 105 103  CO2 24 24  GLUCOSE 119* 179*  BUN 29* 17  CREATININE 1.20* 0.95   No results for input(s): TROPONINI in the last 72 hours.  Invalid input(s): CK, MB Hepatic Function Panel  Recent Labs  09/18/14 1714  PROT 8.0  ALBUMIN 4.4  AST 35  ALT 50  ALKPHOS 56  BILITOT 1.0   No results for input(s): CHOL in the last 72 hours. No results for input(s): PROTIME in the last 72 hours.  Imaging: Imaging results have been reviewed and Ct Abdomen Pelvis Wo Contrast  09/18/2014   CLINICAL DATA:  Acute onset of diarrhea, hematochezia, vomiting and left lower quadrant abdominal pain, radiating to the right lower quadrant. Initial encounter.  EXAM: CT  ABDOMEN AND PELVIS WITHOUT CONTRAST  TECHNIQUE: Multidetector CT imaging of the abdomen and pelvis was performed following the standard protocol without IV contrast.  COMPARISON:  Renal ultrasound performed 04/03/2008  FINDINGS: The visualized lung bases are clear. Diffuse coronary artery calcifications are seen.  A few calcified granulomata are seen about the liver and spleen. The liver and spleen are otherwise unremarkable. The gallbladder is within normal limits. The pancreas and adrenal glands are unremarkable.  The kidneys are unremarkable in appearance. There is no evidence of hydronephrosis. No renal or ureteral stones are seen. A likely vascular calcification is noted at the left renal hilum. No perinephric stranding is appreciated.  No free fluid is identified. The small bowel is unremarkable in appearance. The stomach is within normal limits. No acute vascular abnormalities are seen. Scattered calcification is noted along the abdominal aorta and its branches.  The patient is status post appendectomy. Scattered diverticulosis is noted along the transverse, descending and sigmoid colon, without definite evidence of diverticulitis.  Apparent very mild wall thickening along the sigmoid colon could be artifactual in nature, or could reflect a very mild infectious or inflammatory process, given the patient's symptoms.  The bladder is mildly distended and grossly unremarkable. The patient is status post hysterectomy. The right ovary is unremarkable. No suspicious adnexal masses are seen. No inguinal lymphadenopathy is seen.  No acute osseous abnormalities are identified.  IMPRESSION: 1. Apparent very mild thickening along the sigmoid colon could be artifactual  in nature, or could reflect a very mild infectious or inflammatory process, given the patient's symptoms. 2. Scattered diverticulosis along the transverse, descending and sigmoid colon, without evidence of diverticulitis. 3. Diffuse coronary artery  calcifications seen. 4. Scattered calcification along the abdominal aorta and its branches.   Electronically Signed   By: Roanna Raider M.D.   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

## 2014-09-21 LAB — GLUCOSE, CAPILLARY
GLUCOSE-CAPILLARY: 192 mg/dL — AB (ref 65–99)
Glucose-Capillary: 149 mg/dL — ABNORMAL HIGH (ref 65–99)
Glucose-Capillary: 123 mg/dL — ABNORMAL HIGH (ref 65–99)
Glucose-Capillary: 149 mg/dL — ABNORMAL HIGH (ref 65–99)

## 2014-09-21 MED ORDER — METRONIDAZOLE 500 MG PO TABS
500.0000 mg | ORAL_TABLET | Freq: Three times a day (TID) | ORAL | Status: DC
  Administered 2014-09-21 – 2014-09-22 (×2): 500 mg via ORAL
  Filled 2014-09-21 (×2): qty 1

## 2014-09-21 MED ORDER — CIPROFLOXACIN HCL 500 MG PO TABS
500.0000 mg | ORAL_TABLET | Freq: Two times a day (BID) | ORAL | Status: DC
  Administered 2014-09-21 – 2014-09-22 (×2): 500 mg via ORAL
  Filled 2014-09-21 (×2): qty 1

## 2014-09-21 NOTE — Progress Notes (Signed)
Pt feeling much better still reports nausea at times. Pt is ambulatory to restroom without difficulty. Pt was seen by Dr. Sharyn LullHarwani  Earlier and will be discharge home tomorrow. Pt switched to oral medications.

## 2014-09-21 NOTE — Progress Notes (Signed)
Subjective:  Denies any chest pain or shortness of breath complains of severe headache pain states did not feel strong enough to go home. Also complains of nausea. No further significant diarrhea no fever or chills.  Objective:  Vital Signs in the last 24 hours: Temp:  [98.4 F (36.9 C)-98.7 F (37.1 C)] 98.7 F (37.1 C) (07/10 0438) Pulse Rate:  [64-81] 64 (07/10 0438) Resp:  [16-18] 18 (07/10 0438) BP: (127-151)/(58-71) 151/71 mmHg (07/10 0438) SpO2:  [94 %-97 %] 96 % (07/10 0438)  Intake/Output from previous day: 07/09 0701 - 07/10 0700 In: 2010 [P.O.:360; I.V.:1050; IV Piggyback:600] Out: 3550 [Urine:3550] Intake/Output from this shift: Total I/O In: 120 [P.O.:120] Out: 200 [Urine:200]  Physical Exam: Neck: no adenopathy, no carotid bruit, no JVD and supple, symmetrical, trachea midline Lungs: clear to auscultation bilaterally Heart: regular rate and rhythm, S1, S2 normal and Soft systolic murmur noted Abdomen: soft, non-tender; bowel sounds normal; no masses,  no organomegaly Extremities: extremities normal, atraumatic, no cyanosis or edema  Lab Results:  Recent Labs  09/19/14 0505 09/20/14 0516  WBC 13.2* 9.0  HGB 12.1 12.6  PLT 199 184    Recent Labs  09/19/14 0505 09/20/14 0516  NA 136 134*  K 3.3* 3.9  CL 105 103  CO2 24 24  GLUCOSE 119* 179*  BUN 29* 17  CREATININE 1.20* 0.95   No results for input(s): TROPONINI in the last 72 hours.  Invalid input(s): CK, MB Hepatic Function Panel  Recent Labs  09/18/14 1714  PROT 8.0  ALBUMIN 4.4  AST 35  ALT 50  ALKPHOS 56  BILITOT 1.0   No results for input(s): CHOL in the last 72 hours. No results for input(s): PROTIME in the last 72 hours.  Imaging: Imaging results have been reviewed and No results found.  Cardiac Studies:  Assessment/Plan:  Resolving Acute colitis Probable UTI Status post Dehydration Status post Acute renal injury Coronary artery disease history of silent non-Q-wave MI  in the past status post PCI to CTO of left circumflex in the past Stable angina Hypertension Diabetes mellitus Hypercholesteremia Degenerative joint disease Lumbar radiculopathy History of tobacco abuse GERD History of hiatus hernia Anxiety disorder  Plan DC IV fluids Switch IV anti-biotics 2 by mouth OT PT consult Increase ambulation Possible discharge tomorrow  LOS: 3 days    Rinaldo CloudHarwani, Fatema Rabe 09/21/2014, 12:14 PM

## 2014-09-22 LAB — GLUCOSE, CAPILLARY
GLUCOSE-CAPILLARY: 159 mg/dL — AB (ref 65–99)
Glucose-Capillary: 224 mg/dL — ABNORMAL HIGH (ref 65–99)

## 2014-09-22 MED ORDER — ALPRAZOLAM 1 MG PO TABS: 0.5000 mg | ORAL_TABLET | Freq: Two times a day (BID) | ORAL | Status: DC

## 2014-09-22 MED ORDER — CIPROFLOXACIN HCL 500 MG PO TABS: 500.0000 mg | ORAL_TABLET | Freq: Two times a day (BID) | ORAL | Status: DC

## 2014-09-22 MED ORDER — OXYCODONE HCL 5 MG PO TABS: 5.0000 mg | ORAL_TABLET | Freq: Four times a day (QID) | ORAL | Status: DC | PRN

## 2014-09-22 MED ORDER — NITROGLYCERIN 0.4 MG SL SUBL: 0.4000 mg | SUBLINGUAL_TABLET | SUBLINGUAL | Status: AC | PRN

## 2014-09-22 MED ORDER — ATORVASTATIN CALCIUM 80 MG PO TABS
40.0000 mg | ORAL_TABLET | Freq: Every day | ORAL | Status: AC
Start: 2014-09-22 — End: 2015-09-22

## 2014-09-22 MED ORDER — METRONIDAZOLE 500 MG PO TABS: 500.0000 mg | ORAL_TABLET | Freq: Three times a day (TID) | ORAL | Status: DC

## 2014-09-22 MED ORDER — ASPIRIN 81 MG PO TBEC: 81.0000 mg | DELAYED_RELEASE_TABLET | Freq: Every day | ORAL | Status: AC

## 2014-09-22 NOTE — Evaluation (Signed)
Occupational Therapy Evaluation Patient Details Name: Emily SalinaCharlotte E Mitcham MRN: [id number] DOB: 10/30/1940 Today's Date: 09/22/2014    History of Present Illness 74 yo female admitted with acute colitis. Hx of fibromyalgia, DM, HTN, CAD, MI. Pt is from home with her husband who has dementia.    Clinical Impression   No further OT needed    Follow Up Recommendations  No OT follow up    Equipment Recommendations       Recommendations for Other Services       Precautions / Restrictions Precautions Precautions: Fall Precaution Comments: pt c/o mild dizziness during session Restrictions Weight Bearing Restrictions: No      Mobility Bed Mobility Overal bed mobility: Modified Independent                Transfers Overall transfer level: Needs assistance   Transfers: Sit to/from Stand Sit to Stand: Supervision              Balance Overall balance assessment: Needs assistance         Standing balance support: During functional activity Standing balance-Leahy Scale: Good Standing balance comment: could potentially benefit from  use of cane for improved stability and support                            ADL Overall ADL's : At baseline                                       General ADL Comments: OT edcuated pt and daugther on on safety in the bnathroom. Pt would benefit from a shower chair and grab bars in the shower. Educated on options and where to obtain. Pt and daugther verbalized understanding. Pt doesn not feel she needs HHOT               Pertinent Vitals/Pain Pain Assessment: No/denies pain Pain Score: 2  Pain Location: L LE (sciatica pain) Pain Descriptors / Indicators: Radiating Pain Intervention(s): Monitored during session;Limited activity within patient's tolerance     Hand Dominance     Extremity/Trunk Assessment Upper Extremity Assessment Upper Extremity Assessment: Generalized weakness   Lower  Extremity Assessment Lower Extremity Assessment: Generalized weakness   Cervical / Trunk Assessment Cervical / Trunk Assessment: Normal   Communication Communication Communication: No difficulties   Cognition Arousal/Alertness: Awake/alert Behavior During Therapy: WFL for tasks assessed/performed Overall Cognitive Status: Within Functional Limits for tasks assessed                                Home Living Family/patient expects to be discharged to:: Private residence Living Arrangements: Spouse/significant other Available Help at Discharge: Family Type of Home: Apartment Home Access: Level entry     Home Layout: One level     Bathroom Shower/Tub: Tub/shower unit Shower/tub characteristics: Door FirefighterBathroom Toilet: Standard     Home Equipment: Information systems managerhower seat          Prior Functioning/Environment Level of Independence: Independent                      OT Goals(Current goals can be found in the care plan section) Acute Rehab OT Goals Patient Stated Goal: to get/feel better  OT Frequency:  End of Session    Activity Tolerance: Patient tolerated treatment well Patient left: in chair;with call bell/phone within reach;with nursing/sitter in room;with family/visitor present   Time: 1610-9604 OT Time Calculation (min): 16 min Charges:  OT General Charges $OT Visit: 1 Procedure OT Evaluation $Initial OT Evaluation Tier I: 1 Procedure G-Codes:    Einar Crow D 09-23-14, 1:13 PM

## 2014-09-22 NOTE — Evaluation (Signed)
Physical Therapy Evaluation Patient Details Name: Emily Palmer MRN: [id number] DOB: 06/04/1940 Today's Date: 09/22/2014   History of Present Illness  [age] yo female admitted with acute colitis. Hx of fibromyalgia, DM, HTN, CAD, MI. Pt is from home with her husband who has dementia.   Clinical Impression  On eval, pt required Min guard assist for mobility-able to ambulate ~175 feet with intermittent use of hallway handrail for stability/support. Pt reports L LE pain due to sciatica-followed by Dr Darrelyn HillockGioffre as an outpatient. Pt demonstrates general weakness, decreased activity tolerance, and impaired gait and balance. Recommend HHPT. Pt/family expressed concern over pt's husband (who has dementia and is becoming difficult for pt to manage at home) and next steps they need to take to determine if ALF is needed at some point for husband, and pt eventually. Spoke with SW to see if they could provide any info to pt/family.     Follow Up Recommendations Home health PT;Supervision - Intermittent    Equipment Recommendations  Cane    Recommendations for Other Services OT consult     Precautions / Restrictions Precautions Precautions: Fall Precaution Comments: pt c/o mild dizziness during session Restrictions Weight Bearing Restrictions: No      Mobility  Bed Mobility Overal bed mobility: Modified Independent                Transfers Overall transfer level: Needs assistance   Transfers: Sit to/from Stand Sit to Stand: Supervision            Ambulation/Gait Ambulation/Gait assistance: Min guard Ambulation Distance (Feet): 175 Feet Assistive device:  (intermittent use of handrail) Gait Pattern/deviations: Step-through pattern;Decreased stride length     General Gait Details: close guard for safety. some unsteadiness noted-pt intermittently needed hallway handrail for support. fatigues fairly easily. Pt c/o mild dizziness  Stairs            Wheelchair Mobility    Modified Rankin (Stroke Patients Only)       Balance Overall balance assessment: Needs assistance         Standing balance support: During functional activity Standing balance-Leahy Scale: Good Standing balance comment: could potentially benefit from  use of cane for improved stability and support                             Pertinent Vitals/Pain Pain Assessment: 0-10 Pain Score: 2  Pain Location: L LE (sciatica pain) Pain Descriptors / Indicators: Radiating Pain Intervention(s): Monitored during session;Limited activity within patient's tolerance    Home Living Family/patient expects to be discharged to:: Private residence Living Arrangements: Spouse/significant other Available Help at Discharge: Family (children can check on a couple of days per week) Type of Home: Apartment Home Access: Level entry     Home Layout: One level Home Equipment: None      Prior Function Level of Independence: Independent               Hand Dominance        Extremity/Trunk Assessment   Upper Extremity Assessment: Generalized weakness           Lower Extremity Assessment: Generalized weakness      Cervical / Trunk Assessment: Normal  Communication   Communication: No difficulties  Cognition Arousal/Alertness: Awake/alert Behavior During Therapy: WFL for tasks assessed/performed Overall Cognitive Status: Within Functional Limits for tasks assessed  General Comments      Exercises        Assessment/Plan    PT Assessment Patient needs continued PT services  PT Diagnosis Difficulty walking;Generalized weakness;Abnormality of gait   PT Problem List Decreased strength;Decreased activity tolerance;Decreased balance;Decreased mobility;Decreased knowledge of use of DME;Pain  PT Treatment Interventions DME instruction;Gait training;Functional mobility training;Therapeutic activities;Therapeutic exercise;Patient/family  education;Balance training   PT Goals (Current goals can be found in the Care Plan section) Acute Rehab PT Goals Patient Stated Goal: to get/feel better PT Goal Formulation: With patient/family Time For Goal Achievement: 10/06/14 Potential to Achieve Goals: Good    Frequency Min 3X/week   Barriers to discharge        Co-evaluation               End of Session   Activity Tolerance: Patient limited by fatigue Patient left: in bed;with call bell/phone within reach;with family/visitor present           Time: 1610-9604 PT Time Calculation (min) (ACUTE ONLY): 30 min   Charges:   PT Evaluation $Initial PT Evaluation Tier I: 1 Procedure PT Treatments $Gait Training: 8-22 mins   PT G Codes:        Rebeca Alert, MPT Pager: [phone]

## 2014-09-22 NOTE — Discharge Instructions (Signed)
Abdominal Pain Many things can cause abdominal pain. Usually, abdominal pain is not caused by a disease and will improve without treatment. It can often be observed and treated at home. Your health care provider will do a physical exam and possibly order blood tests and X-rays to help determine the seriousness of your pain. However, in many cases, more time must pass before a clear cause of the pain can be found. Before that point, your health care provider may not know if you need more testing or further treatment. HOME CARE INSTRUCTIONS  Monitor your abdominal pain for any changes. The following actions may help to alleviate any discomfort you are experiencing:  Only take over-the-counter or prescription medicines as directed by your health care provider.  Do not take laxatives unless directed to do so by your health care provider.  Try a clear liquid diet (broth, tea, or water) as directed by your health care provider. Slowly move to a bland diet as tolerated. SEEK MEDICAL CARE IF:  You have unexplained abdominal pain.  You have abdominal pain associated with nausea or diarrhea.  You have pain when you urinate or have a bowel movement.  You experience abdominal pain that wakes you in the night.  You have abdominal pain that is worsened or improved by eating food.  You have abdominal pain that is worsened with eating fatty foods.  You have a fever. SEEK IMMEDIATE MEDICAL CARE IF:   Your pain does not go away within 2 hours.  You keep throwing up (vomiting).  Your pain is felt only in portions of the abdomen, such as the right side or the left lower portion of the abdomen.  You pass bloody or black tarry stools. MAKE SURE YOU:  Understand these instructions.   Will watch your condition.   Will get help right away if you are not doing well or get worse.  Document Released: 12/08/2004 Document Revised: 03/05/2013 Document Reviewed: 11/07/2012 [facility] Patient Information  2015 [place], [place]. This information is not intended to replace advice given to you by your health care provider. Make sure you discuss any questions you have with your health care provider.  Acute Kidney Injury Acute kidney injury is a disease in which there is sudden (acute) damage to the kidneys. The kidneys are 2 organs that lie on either side of the spine between the middle of the back and the front of the abdomen. The kidneys:  Remove wastes and extra water from the blood.   Produce important hormones. These help keep bones strong, regulate blood pressure, and help create red blood cells.   Balance the fluids and chemicals in the blood and tissues. A small amount of kidney damage may not cause problems, but a large amount of damage may make it difficult or impossible for the kidneys to work the way they should. Acute kidney injury may develop into long-lasting (chronic) kidney disease. It may also develop into a life-threatening disease called end-stage kidney disease. Acute kidney injury can get worse very quickly, so it should be treated right away. Early treatment may prevent other kidney diseases from developing.  CAUSES   A problem with blood flow to the kidneys. This may be caused by:   Blood loss.   Heart disease.   Severe burns.   Liver disease.  Direct damage to the kidneys. This may be caused by:  Some medicines.   A kidney infection.   Poisoning or consuming toxic substances.   A surgical wound.  A blow to the kidney area.   A problem with urine flow. This may be caused by:   Cancer.   Kidney stones.   An enlarged prostate. SYMPTOMS   Swelling (edema) of the legs, ankles, or feet.   Tiredness (lethargy).   Nausea or vomiting.   Confusion.   Problems with urination, such as:   Painful or burning feeling during urination.   Decreased urine production.   Frequent accidents in children who are potty trained.   Bloody  urine.   Muscle twitches and cramps.   Shortness of breath.   Seizures.   Chest pain or pressure. Sometimes, no symptoms are present. DIAGNOSIS Acute kidney injury may be detected and diagnosed by tests, including blood, urine, imaging, or kidney biopsy tests.  TREATMENT Treatment of acute kidney injury varies depending on the cause and severity of the kidney damage. In mild cases, no treatment may be needed. The kidneys may heal on their own. If acute kidney injury is more severe, your caregiver will treat the cause of the kidney damage, help the kidneys heal, and prevent complications from occurring. Severe cases may require a procedure to remove toxic wastes from the body (dialysis) or surgery to repair kidney damage. Surgery may involve:   Repair of a torn kidney.   Removal of an obstruction. Most of the time, you will need to stay overnight at the hospital.  HOME CARE INSTRUCTIONS:  Follow your prescribed diet.  Only take over-the-counter or prescription medicines as directed by your caregiver.  Do not take any new medicines (prescription, over-the-counter, or nutritional supplements) unless approved by your caregiver. Many medicines can worsen your kidney damage or need to have the dose adjusted.   Keep all follow-up appointments as directed by your caregiver.  Observe your condition to make sure you are healing as expected. SEEK IMMEDIATE MEDICAL CARE IF:  You are feeling ill or have severe pain in the back or side.   Your symptoms return or you have new symptoms.  You have any symptoms of end-stage kidney disease. These include:   Persistent itchiness.   Loss of appetite.   Headaches.   Abnormally dark or light skin.  Numbness in the hands or feet.   Easy bruising.   Frequent hiccups.   Menstruation stops.   You have a fever.  You have increased urine production.  You have pain or bleeding when urinating. MAKE SURE YOU:    Understand these instructions.  Will watch your condition.  Will get help right away if you are not doing well or get worse Document Released: 09/13/2010 Document Revised: 06/25/2012 Document Reviewed: 10/28/2011 [facility] Patient Information 2015 [place]. This information is not intended to replace advice given to you by your health care provider. Make sure you discuss any questions you have with your health care provider.

## 2014-09-22 NOTE — Progress Notes (Signed)
Pt did not want HH at present time.

## 2014-09-22 NOTE — Discharge Summary (Signed)
Discharge summary dictated on 09/22/2014 dictation number is [id number]

## 2014-09-22 NOTE — Progress Notes (Signed)
ANTIBIOTIC CONSULT NOTE - FOLLOW UP  Pharmacy Consult for cipro Indication: colitis/UTI  Allergies  Allergen Reactions  . Acetaminophen Anaphylaxis  . Sulfa Antibiotics     fever  . Codeine Nausea Only    Patient Measurements: Height: 5\' 6"  (167.6 cm) Weight: 188 lb 1.6 oz (85.322 kg) IBW/kg (Calculated) : 59.3   Vital Signs: Temp: 98.2 F (36.8 C) (07/11 0639) Temp Source: Oral (07/11 0639) BP: 144/65 mmHg (07/11 0639) Pulse Rate: 72 (07/11 0639) Intake/Output from previous day: 07/10 0701 - 07/11 0700 In: 600 [P.O.:600] Out: 1100 [Urine:1100] Intake/Output from this shift:    Labs:  Recent Labs  09/20/14 0516  WBC 9.0  HGB 12.6  PLT 184  CREATININE 0.95   Estimated Creatinine Clearance: 58.9 mL/min (by C-G formula based on Cr of 0.95). No results for input(s): VANCOTROUGH, VANCOPEAK, VANCORANDOM, GENTTROUGH, GENTPEAK, GENTRANDOM, TOBRATROUGH, TOBRAPEAK, TOBRARND, AMIKACINPEAK, AMIKACINTROU, AMIKACIN in the last 72 hours.   Microbiology: Recent Results (from the past 720 hour(s))  Urine culture     Status: None   Collection Time: 09/19/14 12:54 AM  Result Value Ref Range Status   Specimen Description URINE, RANDOM  Final   Special Requests NONE  Final   Culture   Final    NO GROWTH 1 DAY [facility]    Report Status 09/20/2014 FINAL  Final  Clostridium Difficile by PCR (not [facility])     Status: None   Collection Time: 09/20/14  5:43 AM  Result Value Ref Range Status   C difficile by pcr NEGATIVE NEGATIVE Final    Anti-infectives    Start     Dose/Rate Route Frequency Ordered Stop   09/21/14 1600  metroNIDAZOLE (FLAGYL) tablet 500 mg     500 mg Oral 3 times daily 09/21/14 1239     09/21/14 1245  ciprofloxacin (CIPRO) tablet 500 mg     500 mg Oral 2 times daily 09/21/14 1239     09/19/14 2200  ciprofloxacin (CIPRO) IVPB 400 mg  Status:  Discontinued     400 mg 200 mL/hr over 60 Minutes Intravenous Daily at bedtime 09/19/14  0218 09/19/14 1038   09/19/14 1200  ciprofloxacin (CIPRO) IVPB 400 mg  Status:  Discontinued     400 mg 200 mL/hr over 60 Minutes Intravenous Every 12 hours 09/19/14 1038 09/21/14 1239   09/19/14 0600  metroNIDAZOLE (FLAGYL) IVPB 500 mg  Status:  Discontinued     500 mg 100 mL/hr over 60 Minutes Intravenous 4 times per day 09/19/14 0037 09/21/14 1239   09/18/14 2215  cefTRIAXone (ROCEPHIN) 1 g in dextrose 5 % 50 mL IVPB  Status:  Discontinued     1 g 100 mL/hr over 30 Minutes Intravenous Every 24 hours 09/18/14 2212 09/18/14 2213   09/18/14 2215  metroNIDAZOLE (FLAGYL) IVPB 500 mg     500 mg 100 mL/hr over 60 Minutes Intravenous  Once 09/18/14 2212 09/19/14 0211   09/18/14 2215  ciprofloxacin (CIPRO) IVPB 400 mg     400 mg 200 mL/hr over 60 Minutes Intravenous  Once 09/18/14 2213 09/18/14 2344      Assessment: Patient's a  74 y.o F on Cipro day #4 for suspected colitis/UTI. She remains afebrile and all cultures have been negative.  Cipro changed to PO per MD on 7/10.  WBC wnl, scr improved (crcl~59), Afeb  7/7 >>cipro >> 7/7 >> flagyl >>  7/8 urine: NGF (+pyuria) 7/9 Cdiff: negative  Plan:  - continue cipro 500mg  PO  BID  Lucia Gaskins 09/22/2014,9:09 AM

## 2014-09-22 NOTE — Care Management (Signed)
Important Message  Patient Details  Name: Emily Palmer MRN: [id number] Date of Birth: 08/14/1940   Medicare Important Message Given:  Yes-second notification given    Haskell FlirtJamison, Suheyb Raucci 09/22/2014, 12:44 PM

## 2014-09-23 NOTE — Discharge Summary (Signed)
[name]:  Palmer, Emily          ACCOUNT NO.:  [id number]  MEDICAL RECORD NO.:  [id number]  LOCATION:  1428                         [facility]  PHYSICIAN:  Juanelle Trueheart N. Sharyn LullHarwani, M.D. DATE OF BIRTH:  [birth year]  DATE OF ADMISSION:  09/18/2014 DATE OF DISCHARGE:  09/22/2014                              DISCHARGE SUMMARY   ADMITTING DIAGNOSES: 1. Acute colitis. 2. Probable urinary tract infection. 3. Dehydration. 4. Acute renal injury secondary to above. 5. Coronary artery disease, history of silent non-Q-wave myocardial     infarction in the past, status post percutaneous coronary     intervention to chronic total occlusion of left circumflex in the     past. 6. Stable angina. 7. Hypertension. 8. Diabetes mellitus. 9. Hypercholesteremia. 10.Degenerative joint disease. 11.Lumbar radiculopathy. 12.History of tobacco abuse in the past. 13.Gastroesophageal reflux disease. 14.History of hiatal hernia. 15.Anxiety disorder.  FINAL DIAGNOSES: 1. Resolving acute colitis. 2. Status post urinary tract infection. 3. Status post dehydration. 4. Status post acute renal injury. 5. Coronary artery disease, history of silent non-Q-wave myocardial     infarction in the past, status post percutaneous coronary     intervention to chronic total occlusion of the left circumflex. 6. Stable angina. 7. Hypertension. 8. Diabetes mellitus. 9. Hypercholesteremia. 10.Degenerative joint disease. 11.Lumbar radiculopathy. 12.History of tobacco abuse in the past. 13.Gastroesophageal reflux disease. 14.Hiatal hernia. 15.Anxiety disorder.  DISCHARGE MEDICATIONS:  Discharge home medications are: 1. Ciprofloxacin 500 mg 1 tablet twice daily for 5 more days. 2. Flagyl 500 mg 3 times daily for 5 more days. 3. Oxycodone 5 mg 1 tablet every 6 hours for pain as needed as before. 4. Metformin 500 mg twice daily with meals. 5. Metoprolol succinate 50 mg daily. 6. Nitrostat sublingual p.r.n. 7. Zofran  1 tablet 4 mg every 6 hours as needed. 8. Xanax 0.5 mg twice daily. 9. Aspirin 81 mg 1 tablet daily. 10.Atorvastatin 40 mg daily. 11.Lisinopril 20 mg daily.  DIET:  Low-salt, low-cholesterol 1800 calories ADA diet.  FOLLOWUP:  Follow up with me in 1 week.  Follow up with Orthopedics as scheduled.  CONDITION AT DISCHARGE:  Stable.  BRIEF HISTORY AND HOSPITAL COURSE:  Emily Palmer is a 74 year old female with past medical history significant for coronary artery disease, history of silent non-Q-wave MI in the past, status post PCI to chronically occluded left circumflex in July 2016, hypertension, non- insulin-dependent diabetes mellitus, hypercholesteremia, degenerative joint disease, lumbar radiculopathy, history of tobacco abuse, positive family history of coronary artery disease, GERD, history of hiatal hernia, fibromyalgia, anxiety disorder.  She came to the ER, complaining of left lower quadrant abdominal pain associated with nausea, vomiting, and diarrhea for the last 3 days associated with chills, also complains of lower back pain.  States had MRI recently which showed herniated disk and is being followed by Neurosurgery.  The patient was seen by Neurosurgery and was referred to ER.  The patient also complains of difficulty urination, but denies any frequency of urination.  CT of the abdomen showed mild left-sided colitis and was noted to have markedly elevated white blood cell count, partly due to steroid induced/sepsis. The patient also noted to be dehydrated with acute renal injury.  The patient presently  denies any chest pain.  States occasionally gets chest pain off and on.  Has not used any nitro.  Denies any shortness of breath.  Denies palpitation, lightheadedness, or syncope.  Denies PND, orthopnea, or leg swelling.  PHYSICAL EXAMINATION:  GENERAL:  She was alert, awake, oriented x3. VITAL SIGNS:  Blood pressure is 128/70, pulse 94.  She was afebrile. HEENT:   Conjunctivae pink. NECK:  Supple.  No JVD.  No bruit. LUNGS:  Clear to auscultation without rhonchi or rales. CARDIOVASCULAR:  S1, S2 was normal.  There was soft systolic murmur.  No S3, gallop. ABDOMEN:  Soft.  Bowel sounds were present.  There was mild tenderness in the left lower quadrant.  No guarding or rebound. EXTREMITIES:  There is no clubbing, cyanosis, or edema.  LABORATORY DATA:  Sodium was 135, potassium 3.8, BUN 45, creatinine was 1.95.  Hemoglobin was 15.9, hematocrit 45.6, white count of 20.6. Repeat labs were; sodium 136, potassium 3.3, BUN 29, creatinine 1.20 which is trending down.  Hemoglobin 12.1, hematocrit 36.9, white count of 13.2 which is trending down.  On September 20, 2014, BUN was 17, creatinine 0.95 which is back to baseline.  Hemoglobin 12.6, hematocrit 38, white count of 9.0 which is stable.  Urine cultures were negative.  C diff toxin was negative.  CT of the abdomen showed very mild thickening along the sigmoid colon, could be artificial in nature, could reflect very mild infectious or inflammatory process given the patient's symptoms, scattered diverticulosis along with transverse, descending, and sigmoid colon without evidence for diverticulitis.  Diffuse coronary artery calcifications, scattered calcification along the abdominal aorta, and its branches.  BRIEF HOSPITAL COURSE:  The patient was admitted to telemetry unit.  Pan cultures were obtained and was started on Cipro and Flagyl and was slowly hydrated with normalization of her renal function.  The patient remained afebrile during the hospital stay.  Her white count is back to baseline.  IV antibiotics have been switched to p.o. which she is tolerating well.  Her diarrhea has resolved.  The patient will be discharged home on above medications.  Will be followed up in my office in 1 week.  She will keep appointment to see Neurosurgery as an outpatient as scheduled.  We will refer her to GI as an  outpatient for further GI evaluation.     Eduardo Osier. Sharyn Lull, M.D.     MNH/MEDQ  D:  09/22/2014  T:  09/23/2014  Job:  [id number]  cc:   Eduardo Osier. Sharyn Lull, M.D. Fax: [phone]

## 2014-09-24 ENCOUNTER — Other Ambulatory Visit: Payer: Self-pay | Admitting: Orthopedic Surgery

## 2014-09-24 DIAGNOSIS — M5442 Lumbago with sciatica, left side: Secondary | ICD-10-CM

## 2014-09-30 ENCOUNTER — Ambulatory Visit
Admission: RE | Admit: 2014-09-30 | Discharge: 2014-09-30 | Disposition: A | Discharge status: Home / Self Care | Payer: Medicare PPO | Source: Ambulatory Visit | Attending: Orthopedic Surgery | Admitting: Orthopedic Surgery

## 2014-09-30 ENCOUNTER — Ambulatory Visit
Admission: RE | Admit: 2014-09-30 | Discharge: 2014-09-30 | Disposition: A | Discharge status: Home / Self Care | Payer: Self-pay | Source: Ambulatory Visit | Attending: Orthopedic Surgery | Admitting: Orthopedic Surgery

## 2014-09-30 ENCOUNTER — Other Ambulatory Visit: Payer: Self-pay | Admitting: Orthopedic Surgery

## 2014-09-30 DIAGNOSIS — M5442 Lumbago with sciatica, left side: Secondary | ICD-10-CM

## 2014-09-30 DIAGNOSIS — I10 Essential (primary) hypertension: Secondary | ICD-10-CM | POA: Insufficient documentation

## 2014-09-30 MED ORDER — IOHEXOL 180 MG/ML  SOLN: 15.0000 mL | Freq: Once | INTRAMUSCULAR | Status: AC | PRN

## 2014-09-30 NOTE — Progress Notes (Signed)
Dr. Mosetta PuttGrady in to speak with patient and her daughter about wisdom of performing myelogram today while patient is extremely nauseated and unable to keep food and/or fluids down.  Patient is afebrile yet understands it is probably best to reschedule this procedure.  She understands vomiting may increase risk of spinal headache and that it is important to be able to drink extra fluids after the myelogram, which she is unable to do right now.  The daughter will call back to reschedule when patient's GI issues are resolved.  jkl

## 2014-09-30 NOTE — Discharge Instructions (Signed)

## 2014-10-02 ENCOUNTER — Inpatient Hospital Stay (HOSPITAL_COMMUNITY)
Admission: EM | Admit: 2014-10-02 | Discharge: 2014-10-06 | DRG: 683 | Disposition: A | Discharge status: Home / Self Care | Payer: Medicare PPO | Attending: Cardiology | Admitting: Cardiology

## 2014-10-02 ENCOUNTER — Encounter (HOSPITAL_COMMUNITY): Payer: Self-pay

## 2014-10-02 DIAGNOSIS — E860 Dehydration: Secondary | ICD-10-CM | POA: Diagnosis present

## 2014-10-02 DIAGNOSIS — E119 Type 2 diabetes mellitus without complications: Secondary | ICD-10-CM | POA: Diagnosis present

## 2014-10-02 DIAGNOSIS — N19 Unspecified kidney failure: Secondary | ICD-10-CM

## 2014-10-02 DIAGNOSIS — E780 Pure hypercholesterolemia: Secondary | ICD-10-CM | POA: Diagnosis present

## 2014-10-02 DIAGNOSIS — R300 Dysuria: Secondary | ICD-10-CM | POA: Diagnosis not present

## 2014-10-02 DIAGNOSIS — Z87891 Personal history of nicotine dependence: Secondary | ICD-10-CM

## 2014-10-02 DIAGNOSIS — E872 Acidosis: Secondary | ICD-10-CM | POA: Diagnosis present

## 2014-10-02 DIAGNOSIS — Z955 Presence of coronary angioplasty implant and graft: Secondary | ICD-10-CM

## 2014-10-02 DIAGNOSIS — M5416 Radiculopathy, lumbar region: Secondary | ICD-10-CM | POA: Diagnosis present

## 2014-10-02 DIAGNOSIS — K219 Gastro-esophageal reflux disease without esophagitis: Secondary | ICD-10-CM | POA: Diagnosis present

## 2014-10-02 DIAGNOSIS — F419 Anxiety disorder, unspecified: Secondary | ICD-10-CM | POA: Diagnosis present

## 2014-10-02 DIAGNOSIS — M797 Fibromyalgia: Secondary | ICD-10-CM | POA: Diagnosis present

## 2014-10-02 DIAGNOSIS — I252 Old myocardial infarction: Secondary | ICD-10-CM

## 2014-10-02 DIAGNOSIS — T445X5A Adverse effect of predominantly beta-adrenoreceptor agonists, initial encounter: Secondary | ICD-10-CM | POA: Diagnosis present

## 2014-10-02 DIAGNOSIS — N179 Acute kidney failure, unspecified: Principal | ICD-10-CM | POA: Diagnosis present

## 2014-10-02 DIAGNOSIS — I10 Essential (primary) hypertension: Secondary | ICD-10-CM | POA: Diagnosis present

## 2014-10-02 DIAGNOSIS — D649 Anemia, unspecified: Secondary | ICD-10-CM | POA: Diagnosis present

## 2014-10-02 DIAGNOSIS — T383X5A Adverse effect of insulin and oral hypoglycemic [antidiabetic] drugs, initial encounter: Secondary | ICD-10-CM | POA: Diagnosis present

## 2014-10-02 DIAGNOSIS — E875 Hyperkalemia: Secondary | ICD-10-CM | POA: Diagnosis present

## 2014-10-02 DIAGNOSIS — K529 Noninfective gastroenteritis and colitis, unspecified: Secondary | ICD-10-CM | POA: Diagnosis present

## 2014-10-02 DIAGNOSIS — Z7982 Long term (current) use of aspirin: Secondary | ICD-10-CM

## 2014-10-02 HISTORY — DX: Acute myocardial infarction, unspecified: I21.9

## 2014-10-02 HISTORY — DX: Pure hypercholesterolemia, unspecified: E78.00

## 2014-10-02 HISTORY — DX: Inflammatory liver disease, unspecified: K75.9

## 2014-10-02 LAB — CBC WITH DIFFERENTIAL/PLATELET
BASOS ABS: 0 10*3/uL (ref 0.0–0.1)
Basophils Absolute: 0 10*3/uL (ref 0.0–0.1)
Basophils Relative: 0 % (ref 0–1)
Basophils Relative: 0 % (ref 0–1)
Eosinophils Absolute: 0.3 10*3/uL (ref 0.0–0.7)
Eosinophils Absolute: 0.3 10*3/uL (ref 0.0–0.7)
Eosinophils Relative: 3 % (ref 0–5)
Eosinophils Relative: 3 % (ref 0–5)
HCT: 36.2 % (ref 36.0–46.0)
HEMATOCRIT: 28 % — AB (ref 36.0–46.0)
HEMOGLOBIN: 9.7 g/dL — AB (ref 12.0–15.0)
Hemoglobin: 12.6 g/dL (ref 12.0–15.0)
LYMPHS ABS: 1.5 10*3/uL (ref 0.7–4.0)
LYMPHS PCT: 14 % (ref 12–46)
Lymphocytes Relative: 13 % (ref 12–46)
Lymphs Abs: 1.7 10*3/uL (ref 0.7–4.0)
MCH: 29.6 pg (ref 26.0–34.0)
MCH: 29.7 pg (ref 26.0–34.0)
MCHC: 34.6 g/dL (ref 30.0–36.0)
MCHC: 34.8 g/dL (ref 30.0–36.0)
MCV: 85.4 fL (ref 78.0–100.0)
MCV: 85.4 fL (ref 78.0–100.0)
MONOS PCT: 5 % (ref 3–12)
Monocytes Absolute: 0.7 10*3/uL (ref 0.1–1.0)
Monocytes Absolute: 0.4 10*3/uL (ref 0.1–1.0)
Monocytes Relative: 4 % (ref 3–12)
NEUTROS PCT: 79 % — AB (ref 43–77)
NEUTROS PCT: 79 % — AB (ref 43–77)
Neutro Abs: 10.6 10*3/uL — ABNORMAL HIGH (ref 1.7–7.7)
Neutro Abs: 8.3 10*3/uL — ABNORMAL HIGH (ref 1.7–7.7)
Platelets: 371 10*3/uL (ref 150–400)
Platelets: 245 10*3/uL (ref 150–400)
RBC: 3.28 MIL/uL — ABNORMAL LOW (ref 3.87–5.11)
RBC: 4.24 MIL/uL (ref 3.87–5.11)
RDW: 13.2 % (ref 11.5–15.5)
RDW: 13.2 % (ref 11.5–15.5)
WBC: 13.4 10*3/uL — ABNORMAL HIGH (ref 4.0–10.5)
WBC: 10.5 10*3/uL (ref 4.0–10.5)

## 2014-10-02 LAB — COMPREHENSIVE METABOLIC PANEL
ALBUMIN: 3.1 g/dL — AB (ref 3.5–5.0)
ALT: 20 U/L (ref 14–54)
ALT: 18 U/L (ref 14–54)
AST: 31 U/L (ref 15–41)
AST: 29 U/L (ref 15–41)
Albumin: 3.6 g/dL (ref 3.5–5.0)
Alkaline Phosphatase: 62 U/L (ref 38–126)
Alkaline Phosphatase: 59 U/L (ref 38–126)
Anion gap: 18 — ABNORMAL HIGH (ref 5–15)
Anion gap: 15 (ref 5–15)
BUN: 93 mg/dL — ABNORMAL HIGH (ref 6–20)
BUN: 83 mg/dL — AB (ref 6–20)
CALCIUM: 8.3 mg/dL — AB (ref 8.9–10.3)
CO2: 18 mmol/L — ABNORMAL LOW (ref 22–32)
CO2: 21 mmol/L — AB (ref 22–32)
CREATININE: 11.22 mg/dL — AB (ref 0.44–1.00)
Calcium: 7.5 mg/dL — ABNORMAL LOW (ref 8.9–10.3)
Chloride: 96 mmol/L — ABNORMAL LOW (ref 101–111)
Chloride: 99 mmol/L — ABNORMAL LOW (ref 101–111)
Creatinine, Ser: 10.49 mg/dL — ABNORMAL HIGH (ref 0.44–1.00)
GFR calc Af Amer: 3 mL/min — ABNORMAL LOW (ref >60)
GFR calc Af Amer: 4 mL/min — ABNORMAL LOW (ref >60)
GFR calc non Af Amer: 3 mL/min — ABNORMAL LOW (ref >60)
GFR, EST NON AFRICAN AMERICAN: 3 mL/min — AB (ref >60)
Glucose, Bld: 134 mg/dL — ABNORMAL HIGH (ref 65–99)
Glucose, Bld: 112 mg/dL — ABNORMAL HIGH (ref 65–99)
Potassium: 5.3 mmol/L — ABNORMAL HIGH (ref 3.5–5.1)
Potassium: 4.7 mmol/L (ref 3.5–5.1)
SODIUM: 132 mmol/L — AB (ref 135–145)
Sodium: 135 mmol/L (ref 135–145)
TOTAL PROTEIN: 6.3 g/dL — AB (ref 6.5–8.1)
Total Bilirubin: 0.6 mg/dL (ref 0.3–1.2)
Total Bilirubin: 0.3 mg/dL (ref 0.3–1.2)
Total Protein: 7.1 g/dL (ref 6.5–8.1)

## 2014-10-02 LAB — URINE MICROSCOPIC-ADD ON

## 2014-10-02 LAB — GLUCOSE, CAPILLARY
GLUCOSE-CAPILLARY: 105 mg/dL — AB (ref 65–99)
Glucose-Capillary: 85 mg/dL (ref 65–99)

## 2014-10-02 LAB — URINALYSIS, ROUTINE W REFLEX MICROSCOPIC
Bilirubin Urine: NEGATIVE
Glucose, UA: NEGATIVE mg/dL
Ketones, ur: NEGATIVE mg/dL
LEUKOCYTES UA: NEGATIVE
Nitrite: NEGATIVE
PH: 6 (ref 5.0–8.0)
PROTEIN: 30 mg/dL — AB
Specific Gravity, Urine: 1.008 (ref 1.005–1.030)
Urobilinogen, UA: 0.2 mg/dL (ref 0.0–1.0)

## 2014-10-02 LAB — CREATININE, URINE, RANDOM: CREATININE, URINE: 55.76 mg/dL

## 2014-10-02 LAB — I-STAT CG4 LACTIC ACID, ED
LACTIC ACID, VENOUS: 3.25 mmol/L — AB (ref 0.5–2.0)
Lactic Acid, Venous: 2.59 mmol/L (ref 0.5–2.0)

## 2014-10-02 LAB — MAGNESIUM: MAGNESIUM: 1.3 mg/dL — AB (ref 1.7–2.4)

## 2014-10-02 LAB — SODIUM, URINE, RANDOM: Sodium, Ur: 36 mmol/L

## 2014-10-02 MED ORDER — STERILE WATER FOR INJECTION IV SOLN
INTRAVENOUS | Status: DC
  Administered 2014-10-02 – 2014-10-04 (×3): via INTRAVENOUS
  Filled 2014-10-02 (×5): qty 850

## 2014-10-02 MED ORDER — ASPIRIN EC 81 MG PO TBEC
81.0000 mg | DELAYED_RELEASE_TABLET | Freq: Every day | ORAL | Status: DC
  Administered 2014-10-02 – 2014-10-06 (×5): 81 mg via ORAL
  Filled 2014-10-02 (×5): qty 1

## 2014-10-02 MED ORDER — ENSURE ENLIVE PO LIQD: 237.0000 mL | Freq: Two times a day (BID) | ORAL | Status: DC

## 2014-10-02 MED ORDER — METOPROLOL SUCCINATE ER 50 MG PO TB24
50.0000 mg | ORAL_TABLET | Freq: Every day | ORAL | Status: DC
  Administered 2014-10-02 – 2014-10-06 (×5): 50 mg via ORAL
  Filled 2014-10-02 (×5): qty 1

## 2014-10-02 MED ORDER — SODIUM CHLORIDE 0.9 % IV BOLUS (SEPSIS)
500.0000 mL | Freq: Once | INTRAVENOUS | Status: AC
  Administered 2014-10-02: 500 mL via INTRAVENOUS

## 2014-10-02 MED ORDER — STERILE WATER FOR INJECTION IV SOLN
INTRAVENOUS | Status: DC
  Administered 2014-10-02: 15:00:00 via INTRAVENOUS
  Filled 2014-10-02 (×3): qty 850

## 2014-10-02 MED ORDER — HEPARIN SODIUM (PORCINE) 5000 UNIT/ML IJ SOLN
5000.0000 [IU] | Freq: Three times a day (TID) | INTRAMUSCULAR | Status: DC
  Administered 2014-10-02 – 2014-10-06 (×11): 5000 [IU] via SUBCUTANEOUS
  Filled 2014-10-02 (×14): qty 1

## 2014-10-02 MED ORDER — NITROGLYCERIN 0.4 MG SL SUBL: 0.4000 mg | SUBLINGUAL_TABLET | SUBLINGUAL | Status: DC | PRN

## 2014-10-02 MED ORDER — INSULIN ASPART 100 UNIT/ML ~~LOC~~ SOLN
0.0000 [IU] | Freq: Three times a day (TID) | SUBCUTANEOUS | Status: DC
  Administered 2014-10-03 – 2014-10-04 (×2): 2 [IU] via SUBCUTANEOUS

## 2014-10-02 MED ORDER — SODIUM CHLORIDE 0.9 % IV SOLN
INTRAVENOUS | Status: DC
  Administered 2014-10-04: 100 mL/h via INTRAVENOUS
  Administered 2014-10-04: 13:00:00 via INTRAVENOUS

## 2014-10-02 MED ORDER — SODIUM CHLORIDE 0.9 % IV SOLN: INTRAVENOUS | Status: AC

## 2014-10-02 MED ORDER — SODIUM CHLORIDE 0.9 % IV SOLN: INTRAVENOUS | Status: DC

## 2014-10-02 MED ORDER — SODIUM CHLORIDE 0.9 % IV BOLUS (SEPSIS)
1000.0000 mL | Freq: Once | INTRAVENOUS | Status: AC
  Administered 2014-10-02: 1000 mL via INTRAVENOUS

## 2014-10-02 MED ORDER — SODIUM CHLORIDE 0.9 % IV SOLN
INTRAVENOUS | Status: DC
  Administered 2014-10-02: 19:00:00 via INTRAVENOUS

## 2014-10-02 NOTE — ED Notes (Signed)
Pt sent to ED by PCP for elevated BUN (91) and Creatinine (10.5). Pt c/o urinating less often than normal and not drinking much d/t nausea. Hx type 2 diabetes. Recently admitted to [facility] for sepsis and colitis.

## 2014-10-02 NOTE — H&P (Signed)
Emily Palmer is an 74 y.o. female.   Chief Complaint: Diarrhea associated with vague abdominal discomfort nausea and generalized malaise HPI: Patient is 74 year old female with past medical history significant for coronary artery disease history of silent non-Q-wave myocardial infarction in the past status post PCI 2 chronically occluded left circumflex in July 2013, hypertension, non-insulin-dependent diabetes mellitus controlled by diet, hypercholesteremia, degenerative joint disease, history of tobacco abuse in the past, positive family history of coronary artery disease, GERD, osteophyte is hernia, fibromyalgia, anxiety disorder, recently discharged from the hospital treated for colitis and acute renal failure/dehydration. Came to the ER complaining of diarrhea associated with vague abdominal discomfort and nausea for last few days denies any fever or chills denies any urinary complaints but states her urine output has the been reduced. Patient was noted to have creatinine of 11.2 last creatinine on 7/9/ 2016 was 0.95. Patient denies any chest pain or shortness of breath. Patient was noted to have mildly as started with mild hyperkalemia received IV fluid bolus and started on bicarbonate drip. Patient states he is voiding urine now.  Past Medical History  Diagnosis Date  . Complication of anesthesia     form of hepatitis and ran a fever"  . Coronary artery disease   . Anginal pain   . Hypertension   . Anxiety   . Shortness of breath   . Diabetes mellitus     diet controled  . GERD (gastroesophageal reflux disease)   . H/O hiatal hernia   . Fibromyalgia     Past Surgical History  Procedure Laterality Date  . Coronary angioplasty    . Rotator cuff repair    . Abdominal hysterectomy    . Tonsillectomy    . Dilation and curettage of uterus    . Appendectomy    . Left heart catheterization with coronary angiogram N/A 09/22/2011    Procedure: LEFT HEART CATHETERIZATION WITH CORONARY  ANGIOGRAM;  Surgeon: Clent Demark, MD;  Location: [facility] CATH LAB;  Service: Cardiovascular;  Laterality: N/A;  . Percutaneous coronary stent intervention (pci-s)  09/22/2011    Procedure: PERCUTANEOUS CORONARY STENT INTERVENTION (PCI-S);  Surgeon: Clent Demark, MD;  Location: [facility] CATH LAB;  Service: Cardiovascular;;    History reviewed. No pertinent family history. Social History:  reports that she quit smoking about 3 years ago. Her smoking use included Cigarettes. She quit after 41 years of use. She has never used smokeless tobacco. She reports that she does not drink alcohol or use illicit drugs.  Allergies:  Allergies  Allergen Reactions  . Acetaminophen Anaphylaxis  . Nitrofurantoin Other (See Comments)    Drug fever from it,had fever and flu symptoms  . Ibuprofen Nausea And Vomiting  . Codeine Nausea Only  . Sulfa Antibiotics Other (See Comments)    fever     (Not in a hospital admission)  Results for orders placed or performed during the hospital encounter of 10/02/14 (from the past 48 hour(s))  Comprehensive metabolic panel     Status: Abnormal   Collection Time: 10/02/14 11:15 AM  Result Value Ref Range   Sodium 132 (L) 135 - 145 mmol/L   Potassium 5.3 (H) 3.5 - 5.1 mmol/L   Chloride 96 (L) 101 - 111 mmol/L   CO2 18 (L) 22 - 32 mmol/L   Glucose, Bld 134 (H) 65 - 99 mg/dL   BUN 93 (H) 6 - 20 mg/dL   Creatinine, Ser 11.22 (H) 0.44 - 1.00 mg/dL   Calcium 8.3 (L)  8.9 - 10.3 mg/dL   Total Protein 7.1 6.5 - 8.1 g/dL   Albumin 3.6 3.5 - 5.0 g/dL   AST 31 15 - 41 U/L   ALT 20 14 - 54 U/L   Alkaline Phosphatase 62 38 - 126 U/L   Total Bilirubin 0.6 0.3 - 1.2 mg/dL   GFR calc non Af Amer 3 (L) >60 mL/min   GFR calc Af Amer 3 (L) >60 mL/min    Comment: (NOTE) The eGFR has been calculated using the CKD EPI equation. This calculation has not been validated in all clinical situations. eGFR's persistently <60 mL/min signify possible Chronic Kidney Disease.    Anion gap 18  (H) 5 - 15  CBC with Differential     Status: Abnormal   Collection Time: 10/02/14 11:15 AM  Result Value Ref Range   WBC 13.4 (H) 4.0 - 10.5 K/uL   RBC 3.28 (L) 3.87 - 5.11 MIL/uL   Hemoglobin 9.7 (L) 12.0 - 15.0 g/dL   HCT 28.0 (L) 36.0 - 46.0 %   MCV 85.4 78.0 - 100.0 fL   MCH 29.6 26.0 - 34.0 pg   MCHC 34.6 30.0 - 36.0 g/dL   RDW 13.2 11.5 - 15.5 %   Platelets 371 150 - 400 K/uL   Neutrophils Relative % 79 (H) 43 - 77 %   Neutro Abs 10.6 (H) 1.7 - 7.7 K/uL   Lymphocytes Relative 13 12 - 46 %   Lymphs Abs 1.7 0.7 - 4.0 K/uL   Monocytes Relative 5 3 - 12 %   Monocytes Absolute 0.7 0.1 - 1.0 K/uL   Eosinophils Relative 3 0 - 5 %   Eosinophils Absolute 0.3 0.0 - 0.7 K/uL   Basophils Relative 0 0 - 1 %   Basophils Absolute 0.0 0.0 - 0.1 K/uL  I-Stat CG4 Lactic Acid, ED (Not at [facility])     Status: Abnormal   Collection Time: 10/02/14 11:34 AM  Result Value Ref Range   Lactic Acid, Venous 3.25 (HH) 0.5 - 2.0 mmol/L   Comment NOTIFIED PHYSICIAN   Urinalysis, Routine w reflex microscopic (not at [facility])     Status: Abnormal   Collection Time: 10/02/14 11:42 AM  Result Value Ref Range   Color, Urine YELLOW YELLOW   APPearance CLEAR CLEAR   Specific Gravity, Urine 1.008 1.005 - 1.030   pH 6.0 5.0 - 8.0   Glucose, UA NEGATIVE NEGATIVE mg/dL   Hgb urine dipstick SMALL (A) NEGATIVE   Bilirubin Urine NEGATIVE NEGATIVE   Ketones, ur NEGATIVE NEGATIVE mg/dL   Protein, ur 30 (A) NEGATIVE mg/dL   Urobilinogen, UA 0.2 0.0 - 1.0 mg/dL   Nitrite NEGATIVE NEGATIVE   Leukocytes, UA NEGATIVE NEGATIVE  Urine microscopic-add on     Status: None   Collection Time: 10/02/14 11:42 AM  Result Value Ref Range   Squamous Epithelial / LPF RARE RARE   WBC, UA 3-6 <3 WBC/hpf   RBC / HPF 0-2 <3 RBC/hpf   Bacteria, UA RARE RARE  Sodium, urine, random     Status: None   Collection Time: 10/02/14 11:49 AM  Result Value Ref Range   Sodium, Ur 36 mmol/L  Creatinine, urine, random     Status: None    Collection Time: 10/02/14 11:49 AM  Result Value Ref Range   Creatinine, Urine 55.76 mg/dL  I-Stat CG4 Lactic Acid, ED (Not at [facility])     Status: Abnormal   Collection Time: 10/02/14  3:16 PM  Result Value  Ref Range   Lactic Acid, Venous 2.59 (HH) 0.5 - 2.0 mmol/L   Comment NOTIFIED PHYSICIAN    No results found.  Review of Systems  Constitutional: Positive for malaise/fatigue. Negative for fever and chills.  HENT: Negative for hearing loss.   Eyes: Negative for double vision.  Respiratory: Negative for cough, hemoptysis and sputum production.   Cardiovascular: Negative for chest pain, palpitations and orthopnea.  Gastrointestinal: Positive for abdominal pain and diarrhea.  Genitourinary: Negative for dysuria.  Neurological: Negative for dizziness and headaches.    Blood pressure 135/63, pulse 89, resp. rate 17, weight 81.647 kg (180 lb), SpO2 96 %. Physical Exam  Constitutional: She is oriented to person, place, and time.  HENT:  Head: Normocephalic and atraumatic.  Eyes: Conjunctivae are normal. Pupils are equal, round, and reactive to light. Left eye exhibits no discharge. No scleral icterus.  Neck: Normal range of motion. Neck supple. No JVD present. No tracheal deviation present. No thyromegaly present.  Cardiovascular: Normal rate and regular rhythm.   Murmur (Soft systolic murmur noted) heard. Respiratory: Effort normal and breath sounds normal. No respiratory distress. She has no wheezes. She has no rales.  GI: Soft. Bowel sounds are normal. She exhibits no distension. There is tenderness (Minimal tenderness left lower quadrant no guarding no rebound). There is no rebound and no guarding.  Musculoskeletal: She exhibits no edema or tenderness.  Neurological: She is alert and oriented to person, place, and time.     Assessment/Plan Acute renal injury multifactorial i.e. dehydration/ACE inhibitor Acute gastroenteritis Dehydration secondary to above Coronary artery disease  history of silent MI in the past status post PCI to CTO of left circumflex in the past Hypertension Diabetes mellitus Mild hyperkalemia Mild leukocytosis Metabolic acidosis Lumbar radiculopathy History of tobacco abuse GERD History of hiatus hernia Anxiety disorder Anemia rule out GI loss Plan As per orders Nephrology consult GI consult Check labs Charolette Forward 10/02/2014, 4:43 PM

## 2014-10-02 NOTE — ED Provider Notes (Signed)
CSN: [id number]     Arrival date & time 10/02/14  1100 History   First MD Initiated Contact with Patient 10/02/14 1102     Chief Complaint  Patient presents with  . Dysuria     (Consider location/radiation/quality/duration/timing/severity/associated sxs/prior Treatment) HPI.... Level V caveat for urgent need for intervention. Nausea, vomiting, diarrhea, general malaise since chest today. Patient seen by primary care doctor today and creatinine noted to be elevated. Decreased oral intake. Recent hospital admission for colitis and sepsis.  Other health problems include coronary artery disease, hypertension, diabetes, fibromyalgia.  Past Medical History  Diagnosis Date  . Complication of anesthesia     form of hepatitis and ran a fever"  . Coronary artery disease   . Anginal pain   . Hypertension   . Anxiety   . Shortness of breath   . Diabetes mellitus     diet controled  . GERD (gastroesophageal reflux disease)   . H/O hiatal hernia   . Fibromyalgia    Past Surgical History  Procedure Laterality Date  . Coronary angioplasty    . Rotator cuff repair    . Abdominal hysterectomy    . Tonsillectomy    . Dilation and curettage of uterus    . Appendectomy    . Left heart catheterization with coronary angiogram N/A 09/22/2011    Procedure: LEFT HEART CATHETERIZATION WITH CORONARY ANGIOGRAM;  Surgeon: Robynn Pane, MD;  Location: [facility] CATH LAB;  Service: Cardiovascular;  Laterality: N/A;  . Percutaneous coronary stent intervention (pci-s)  09/22/2011    Procedure: PERCUTANEOUS CORONARY STENT INTERVENTION (PCI-S);  Surgeon: Robynn Pane, MD;  Location: [facility] CATH LAB;  Service: Cardiovascular;;   History reviewed. No pertinent family history. History  Substance Use Topics  . Smoking status: Former Smoker -- 41 years    Types: Cigarettes    Quit date: 09/22/2011  . Smokeless tobacco: Never Used  . Alcohol Use: No   OB History    No data available     Review of Systems   Unable to perform ROS: Acuity of condition      Allergies  Acetaminophen; Nitrofurantoin; Ibuprofen; Codeine; and Sulfa antibiotics  Home Medications   Prior to Admission medications   Medication Sig Start Date End Date Taking? Authorizing Provider  ALPRAZolam Prudy Feeler) 1 MG tablet Take 0.5 tablets (0.5 mg total) by mouth 2 (two) times daily. 09/22/14  Yes Rinaldo Cloud, MD  aspirin EC 81 MG EC tablet Take 1 tablet (81 mg total) by mouth daily. 09/22/14  Yes Rinaldo Cloud, MD  atorvastatin (LIPITOR) 80 MG tablet Take 0.5 tablets (40 mg total) by mouth daily at 6 PM. 09/22/14 09/22/15 Yes Rinaldo Cloud, MD  lisinopril (PRINIVIL,ZESTRIL) 20 MG tablet Take 20 mg by mouth daily.    Yes Historical Provider, MD  metFORMIN (GLUCOPHAGE-XR) 500 MG 24 hr tablet Take 500 mg by mouth 2 (two) times daily.   Yes Historical Provider, MD  metoprolol succinate (TOPROL-XL) 50 MG 24 hr tablet Take 50 mg by mouth daily. Take with or immediately following a meal.   Yes Historical Provider, MD  Multiple Vitamin (MULTIVITAMIN) capsule Take 1 capsule by mouth.   Yes Historical Provider, MD  nitroGLYCERIN (NITROSTAT) 0.4 MG SL tablet Place 1 tablet (0.4 mg total) under the tongue every 5 (five) minutes x 3 doses as needed for chest pain. 09/22/14  Yes Rinaldo Cloud, MD  vitamin E (VITAMIN E) 400 UNIT capsule Take 400 Units by mouth.   Yes Historical Provider, MD  BP 141/72 mmHg  Pulse 92  Resp 17  Wt 180 lb (81.647 kg)  SpO2 96% Physical Exam  Constitutional: She is oriented to person, place, and time.  Alert, pale  HENT:  Head: Normocephalic and atraumatic.  Eyes: Conjunctivae and EOM are normal. Pupils are equal, round, and reactive to light.  Neck: Normal range of motion. Neck supple.  Cardiovascular: Normal rate and regular rhythm.   Pulmonary/Chest: Effort normal and breath sounds normal.  Abdominal: Soft. Bowel sounds are normal.  Musculoskeletal: Normal range of motion.  Neurological: She is alert  and oriented to person, place, and time.  Skin: Skin is warm and dry.  Psychiatric: She has a normal mood and affect. Her behavior is normal.  Nursing note and vitals reviewed.   ED Course  Procedures (including critical care time) Labs Review Labs Reviewed  COMPREHENSIVE METABOLIC PANEL - Abnormal; Notable for the following:    Sodium 132 (*)    Potassium 5.3 (*)    Chloride 96 (*)    CO2 18 (*)    Glucose, Bld 134 (*)    BUN 93 (*)    Creatinine, Ser 11.22 (*)    Calcium 8.3 (*)    GFR calc non Af Amer 3 (*)    GFR calc Af Amer 3 (*)    Anion gap 18 (*)    All other components within normal limits  CBC WITH DIFFERENTIAL/PLATELET - Abnormal; Notable for the following:    WBC 13.4 (*)    RBC 3.28 (*)    Hemoglobin 9.7 (*)    HCT 28.0 (*)    Neutrophils Relative % 79 (*)    Neutro Abs 10.6 (*)    All other components within normal limits  URINALYSIS, ROUTINE W REFLEX MICROSCOPIC (NOT AT [facility]) - Abnormal; Notable for the following:    Hgb urine dipstick SMALL (*)    Protein, ur 30 (*)    All other components within normal limits  I-STAT CG4 LACTIC ACID, ED - Abnormal; Notable for the following:    Lactic Acid, Venous 3.25 (*)    All other components within normal limits  CULTURE, BLOOD (ROUTINE X 2)  CULTURE, BLOOD (ROUTINE X 2)  URINE CULTURE  URINE MICROSCOPIC-ADD ON  I-STAT CG4 LACTIC ACID, ED    Imaging Review No results found.   EKG Interpretation   Date/Time:  Thursday October 02 2014 11:17:51 EDT Ventricular Rate:  85 PR Interval:  157 QRS Duration: 67 QT Interval:  388 QTC Calculation: 461 R Axis:   -54 Text Interpretation:  Sinus rhythm Left anterior fascicular block Low  voltage, precordial leads Consider anterior infarct Confirmed by Elga Santy  MD,  Tinleigh Whitmire ([id number]) on 10/02/2014 11:24:05 AM     CRITICAL CARE Performed by: Donnetta Hutching Total critical care time: 30 Critical care time was exclusive of separately billable procedures and treating other  patients. Critical care was necessary to treat or prevent imminent or life-threatening deterioration. Critical care was time spent personally by me on the following activities: development of treatment plan with patient and/or surrogate as well as nursing, discussions with consultants, evaluation of patient's response to treatment, examination of patient, obtaining history from patient or surrogate, ordering and performing treatments and interventions, ordering and review of laboratory studies, ordering and review of radiographic studies, pulse oximetry and re-evaluation of patient's condition. MDM   Final diagnoses:  Renal failure    Creatinine noted to be 11.22.     Discussed with nephrologist Dr Hyman Hopes.  Rx IV fluids, foley catheter, bicarbonate drip.    Admit to Dr. Imagene Sheller, MD 10/02/14 [phone]

## 2014-10-02 NOTE — Consult Note (Signed)
Reason for Consult: elevated creatinine Referring Physician: Dr. Nat Christen  Emily Palmer is an 74 y.o. female.  HPI: Pt is a 74 y/o female w/ PMHx of HTN, CAD, anxiety, fibromyalgia, and GERD who presents from her PCP office due to elevated creatinine. Pt has had decreased appetite for 3 weeks with poor po intake, and n/v. She had dry heaving this morning and vomited clear liquid yesterday. She has 1 watery BM per day, denies dark stools or hematochezia. She was recently admitted from 7/7-11 for acute colitis, UTI, and dehydration. She was discharged on cipro and flagyl for colitis which she finished last week. She is also on lisinopril which she last took this morning. Denies NSAID use, dysuria, and hematuria. She does have urinary hesitancy. On presentation creatinine was 11.22 and she was given a 1.5L NS bolus and started on a sodium bicarbonate drip. Nephrology was consulted for elevated creatinine.     Past Medical History  Diagnosis Date  . Complication of anesthesia     form of hepatitis and ran a fever"  . Coronary artery disease   . Anginal pain   . Hypertension   . Anxiety   . Shortness of breath   . Diabetes mellitus     diet controled  . GERD (gastroesophageal reflux disease)   . H/O hiatal hernia   . Fibromyalgia     Past Surgical History  Procedure Laterality Date  . Coronary angioplasty    . Rotator cuff repair    . Abdominal hysterectomy    . Tonsillectomy    . Dilation and curettage of uterus    . Appendectomy    . Left heart catheterization with coronary angiogram N/A 09/22/2011    Procedure: LEFT HEART CATHETERIZATION WITH CORONARY ANGIOGRAM;  Surgeon: Clent Demark, MD;  Location: [facility] CATH LAB;  Service: Cardiovascular;  Laterality: N/A;  . Percutaneous coronary stent intervention (pci-s)  09/22/2011    Procedure: PERCUTANEOUS CORONARY STENT INTERVENTION (PCI-S);  Surgeon: Clent Demark, MD;  Location: [facility] CATH LAB;  Service: Cardiovascular;;     History reviewed. No pertinent family history.  Social History:  reports that she quit smoking about 3 years ago. Her smoking use included Cigarettes. She quit after 41 years of use. She has never used smokeless tobacco. She reports that she does not drink alcohol or use illicit drugs.  Allergies:  Allergies  Allergen Reactions  . Acetaminophen Anaphylaxis  . Nitrofurantoin Other (See Comments)    Drug fever from it,had fever and flu symptoms  . Ibuprofen Nausea And Vomiting  . Codeine Nausea Only  . Sulfa Antibiotics Other (See Comments)    fever    Medications: Prior to Admission:  (Not in a hospital admission) Scheduled:    Results for orders placed or performed during the hospital encounter of 10/02/14 (from the past 48 hour(s))  Comprehensive metabolic panel     Status: Abnormal   Collection Time: 10/02/14 11:15 AM  Result Value Ref Range   Sodium 132 (L) 135 - 145 mmol/L   Potassium 5.3 (H) 3.5 - 5.1 mmol/L   Chloride 96 (L) 101 - 111 mmol/L   CO2 18 (L) 22 - 32 mmol/L   Glucose, Bld 134 (H) 65 - 99 mg/dL   BUN 93 (H) 6 - 20 mg/dL   Creatinine, Ser 11.22 (H) 0.44 - 1.00 mg/dL   Calcium 8.3 (L) 8.9 - 10.3 mg/dL   Total Protein 7.1 6.5 - 8.1 g/dL   Albumin 3.6 3.5 -  5.0 g/dL   AST 31 15 - 41 U/L   ALT 20 14 - 54 U/L   Alkaline Phosphatase 62 38 - 126 U/L   Total Bilirubin 0.6 0.3 - 1.2 mg/dL   GFR calc non Af Amer 3 (L) >60 mL/min   GFR calc Af Amer 3 (L) >60 mL/min    Comment: (NOTE) The eGFR has been calculated using the CKD EPI equation. This calculation has not been validated in all clinical situations. eGFR's persistently <60 mL/min signify possible Chronic Kidney Disease.    Anion gap 18 (H) 5 - 15  CBC with Differential     Status: Abnormal   Collection Time: 10/02/14 11:15 AM  Result Value Ref Range   WBC 13.4 (H) 4.0 - 10.5 K/uL   RBC 3.28 (L) 3.87 - 5.11 MIL/uL   Hemoglobin 9.7 (L) 12.0 - 15.0 g/dL   HCT 28.0 (L) 36.0 - 46.0 %   MCV 85.4 78.0  - 100.0 fL   MCH 29.6 26.0 - 34.0 pg   MCHC 34.6 30.0 - 36.0 g/dL   RDW 13.2 11.5 - 15.5 %   Platelets 371 150 - 400 K/uL   Neutrophils Relative % 79 (H) 43 - 77 %   Neutro Abs 10.6 (H) 1.7 - 7.7 K/uL   Lymphocytes Relative 13 12 - 46 %   Lymphs Abs 1.7 0.7 - 4.0 K/uL   Monocytes Relative 5 3 - 12 %   Monocytes Absolute 0.7 0.1 - 1.0 K/uL   Eosinophils Relative 3 0 - 5 %   Eosinophils Absolute 0.3 0.0 - 0.7 K/uL   Basophils Relative 0 0 - 1 %   Basophils Absolute 0.0 0.0 - 0.1 K/uL  I-Stat CG4 Lactic Acid, ED (Not at [facility])     Status: Abnormal   Collection Time: 10/02/14 11:34 AM  Result Value Ref Range   Lactic Acid, Venous 3.25 (HH) 0.5 - 2.0 mmol/L   Comment NOTIFIED PHYSICIAN   Urinalysis, Routine w reflex microscopic (not at [facility])     Status: Abnormal   Collection Time: 10/02/14 11:42 AM  Result Value Ref Range   Color, Urine YELLOW YELLOW   APPearance CLEAR CLEAR   Specific Gravity, Urine 1.008 1.005 - 1.030   pH 6.0 5.0 - 8.0   Glucose, UA NEGATIVE NEGATIVE mg/dL   Hgb urine dipstick SMALL (A) NEGATIVE   Bilirubin Urine NEGATIVE NEGATIVE   Ketones, ur NEGATIVE NEGATIVE mg/dL   Protein, ur 30 (A) NEGATIVE mg/dL   Urobilinogen, UA 0.2 0.0 - 1.0 mg/dL   Nitrite NEGATIVE NEGATIVE   Leukocytes, UA NEGATIVE NEGATIVE  Urine microscopic-add on     Status: None   Collection Time: 10/02/14 11:42 AM  Result Value Ref Range   Squamous Epithelial / LPF RARE RARE   WBC, UA 3-6 <3 WBC/hpf   RBC / HPF 0-2 <3 RBC/hpf   Bacteria, UA RARE RARE  I-Stat CG4 Lactic Acid, ED (Not at [facility])     Status: Abnormal   Collection Time: 10/02/14  3:16 PM  Result Value Ref Range   Lactic Acid, Venous 2.59 (HH) 0.5 - 2.0 mmol/L   Comment NOTIFIED PHYSICIAN     No results found.  Review of Systems  Constitutional: Positive for weight loss (8lb weight loss since 7/11).  Respiratory: Negative for shortness of breath.   Cardiovascular: Negative for chest pain and leg swelling.   Gastrointestinal: Positive for heartburn, nausea, vomiting and diarrhea. Negative for blood in stool and melena.  Genitourinary:  Negative for dysuria, urgency, frequency and hematuria.   Blood pressure 141/72, pulse 92, resp. rate 17, weight 180 lb (81.647 kg), SpO2 96 %. Physical Exam  Constitutional: She appears well-developed and well-nourished. No distress.  HENT:  Head: Normocephalic and atraumatic.  Mouth/Throat: No oropharyngeal exudate.  Dry mucous membranes   Eyes: Conjunctivae and EOM are normal. No scleral icterus.  Cardiovascular: Normal rate and regular rhythm.   No murmur heard. Respiratory: Effort normal and breath sounds normal. No respiratory distress. She has no wheezes. She has no rales.  GI: Soft. Bowel sounds are normal. She exhibits no distension. There is no tenderness.  Musculoskeletal: She exhibits no edema.  Neurological: She is alert.  Skin: Skin is warm and dry. She is not diaphoretic.  Skin tenting, good capillary refill      Assessment/Plan: 1 AKI- Creatinine 11.22 on admission b/l around 0.8-0.95. Likely pre- renal due to decreased po intake, diarrhea, and ACEinh use. Received 1.5L bolus in the ED.  - NS at 125 cc/hr, EF 84% on nuclear stress test 10/03/12 - FENa pending - strict I/O's - UA non revealing, 30 protein, urine culture pending 2 Hyperkalemia- EKG neg for peaked T waves, likely will resolve with IVFs, will follow chem panel.  3 Anion gap metabolic acidosis- AG 18 w/ elevated lactic acid of 3.25 >2.59 and uremia the most likely source. Started on bicarb gtt int he ED transition to IVFs as above. 4. Hypertension: hold ACEinh 5. Normocytic anemia- hgb 9.7, b/l around 12-14. Can consider anemia panel as outpatient.  6. Leukocytosis- WBC 13.4. Pt afebrile, not tachycardic, and respirations WNL, thus she does not meet SIRS criteria. Blood and urine cultures pending. Trend lactic acid.   Julious Oka 10/02/2014, 3:33 PM

## 2014-10-02 NOTE — Progress Notes (Signed)
Paged Dr.

## 2014-10-02 NOTE — Progress Notes (Signed)
Spoke with pt and her son's re: resources for pt's husband, including ALF lists and [place] benefits for ALF.  Call placed to contact at local ALF, await return call.  ED CSW will speak with pt/family again once further information is gathered.

## 2014-10-03 DIAGNOSIS — I10 Essential (primary) hypertension: Secondary | ICD-10-CM | POA: Diagnosis present

## 2014-10-03 DIAGNOSIS — T445X5A Adverse effect of predominantly beta-adrenoreceptor agonists, initial encounter: Secondary | ICD-10-CM | POA: Diagnosis present

## 2014-10-03 DIAGNOSIS — Z7982 Long term (current) use of aspirin: Secondary | ICD-10-CM | POA: Diagnosis not present

## 2014-10-03 DIAGNOSIS — N179 Acute kidney failure, unspecified: Secondary | ICD-10-CM | POA: Diagnosis present

## 2014-10-03 DIAGNOSIS — E860 Dehydration: Secondary | ICD-10-CM | POA: Diagnosis present

## 2014-10-03 DIAGNOSIS — M5416 Radiculopathy, lumbar region: Secondary | ICD-10-CM | POA: Diagnosis present

## 2014-10-03 DIAGNOSIS — K219 Gastro-esophageal reflux disease without esophagitis: Secondary | ICD-10-CM | POA: Diagnosis present

## 2014-10-03 DIAGNOSIS — K529 Noninfective gastroenteritis and colitis, unspecified: Secondary | ICD-10-CM | POA: Diagnosis present

## 2014-10-03 DIAGNOSIS — F419 Anxiety disorder, unspecified: Secondary | ICD-10-CM | POA: Diagnosis present

## 2014-10-03 DIAGNOSIS — M797 Fibromyalgia: Secondary | ICD-10-CM | POA: Diagnosis present

## 2014-10-03 DIAGNOSIS — E780 Pure hypercholesterolemia: Secondary | ICD-10-CM | POA: Diagnosis present

## 2014-10-03 DIAGNOSIS — I252 Old myocardial infarction: Secondary | ICD-10-CM | POA: Diagnosis not present

## 2014-10-03 DIAGNOSIS — E119 Type 2 diabetes mellitus without complications: Secondary | ICD-10-CM | POA: Diagnosis present

## 2014-10-03 DIAGNOSIS — E875 Hyperkalemia: Secondary | ICD-10-CM | POA: Diagnosis present

## 2014-10-03 DIAGNOSIS — Z955 Presence of coronary angioplasty implant and graft: Secondary | ICD-10-CM | POA: Diagnosis not present

## 2014-10-03 DIAGNOSIS — Z87891 Personal history of nicotine dependence: Secondary | ICD-10-CM | POA: Diagnosis not present

## 2014-10-03 DIAGNOSIS — E872 Acidosis: Secondary | ICD-10-CM | POA: Diagnosis present

## 2014-10-03 DIAGNOSIS — T383X5A Adverse effect of insulin and oral hypoglycemic [antidiabetic] drugs, initial encounter: Secondary | ICD-10-CM | POA: Diagnosis present

## 2014-10-03 DIAGNOSIS — D649 Anemia, unspecified: Secondary | ICD-10-CM | POA: Diagnosis present

## 2014-10-03 DIAGNOSIS — R300 Dysuria: Secondary | ICD-10-CM | POA: Diagnosis present

## 2014-10-03 LAB — BASIC METABOLIC PANEL
ANION GAP: 14 (ref 5–15)
BUN: 83 mg/dL — ABNORMAL HIGH (ref 6–20)
CHLORIDE: 100 mmol/L — AB (ref 101–111)
CO2: 25 mmol/L (ref 22–32)
Calcium: 7.4 mg/dL — ABNORMAL LOW (ref 8.9–10.3)
Creatinine, Ser: 9.66 mg/dL — ABNORMAL HIGH (ref 0.44–1.00)
GFR calc Af Amer: 4 mL/min — ABNORMAL LOW (ref >60)
GFR calc non Af Amer: 4 mL/min — ABNORMAL LOW (ref >60)
Glucose, Bld: 81 mg/dL (ref 65–99)
Potassium: 4.6 mmol/L (ref 3.5–5.1)
Sodium: 139 mmol/L (ref 135–145)

## 2014-10-03 LAB — CBC
HEMATOCRIT: 31.9 % — AB (ref 36.0–46.0)
HEMOGLOBIN: 11 g/dL — AB (ref 12.0–15.0)
MCH: 29.3 pg (ref 26.0–34.0)
MCHC: 34.5 g/dL (ref 30.0–36.0)
MCV: 85.1 fL (ref 78.0–100.0)
Platelets: 240 10*3/uL (ref 150–400)
RBC: 3.75 MIL/uL — ABNORMAL LOW (ref 3.87–5.11)
RDW: 13 % (ref 11.5–15.5)
WBC: 8.4 10*3/uL (ref 4.0–10.5)

## 2014-10-03 LAB — URINE CULTURE: Culture: NO GROWTH

## 2014-10-03 LAB — GLUCOSE, CAPILLARY
GLUCOSE-CAPILLARY: 95 mg/dL (ref 65–99)
GLUCOSE-CAPILLARY: 169 mg/dL — AB (ref 65–99)
GLUCOSE-CAPILLARY: 87 mg/dL (ref 65–99)
GLUCOSE-CAPILLARY: 106 mg/dL — AB (ref 65–99)

## 2014-10-03 LAB — HEMOGLOBIN A1C
Hgb A1c MFr Bld: 7.2 % — ABNORMAL HIGH (ref 4.8–5.6)
MEAN PLASMA GLUCOSE: 160 mg/dL

## 2014-10-03 MED ORDER — ENSURE ENLIVE PO LIQD
237.0000 mL | Freq: Every day | ORAL | Status: DC
  Administered 2014-10-04: 237 mL via ORAL

## 2014-10-03 NOTE — Progress Notes (Signed)
Subjective:  Denies any abdominal pain nausea vomiting or diarrhea states feels better. Renal function slowly improving  Objective:  Vital Signs in the last 24 hours: Temp:  [98.2 F (36.8 C)-99.3 F (37.4 C)] 98.2 F (36.8 C) (07/22 1015) Pulse Rate:  [68-94] 73 (07/22 1015) Resp:  [11-23] 18 (07/22 1015) BP: (96-159)/(57-92) 146/92 mmHg (07/22 1015) SpO2:  [95 %-100 %] 97 % (07/22 1015) Weight:  [81.647 kg (180 lb)] 81.647 kg (180 lb) (07/21 1143)  Intake/Output from previous day: 07/21 0701 - 07/22 0700 In: 2332.5 [P.O.:360; I.V.:1972.5] Out: 1900 [Urine:1900] Intake/Output from this shift: Total I/O In: -  Out: 750 [Urine:750]  Physical Exam: Neck: no adenopathy, no carotid bruit, no JVD and supple, symmetrical, trachea midline Lungs: clear to auscultation bilaterally Heart: regular rate and rhythm, S1, S2 normal and Soft systolic murmur noted no S3 gallop no rub Abdomen: soft, non-tender; bowel sounds normal; no masses,  no organomegaly Extremities: extremities normal, atraumatic, no cyanosis or edema  Lab Results:  Recent Labs  10/02/14 1826 10/03/14 0532  WBC 10.5 8.4  HGB 12.6 11.0*  PLT 245 240    Recent Labs  10/02/14 1826 10/03/14 0532  NA 135 139  K 4.7 4.6  CL 99* 100*  CO2 21* 25  GLUCOSE 112* 81  BUN 83* 83*  CREATININE 10.49* 9.66*   No results for input(s): TROPONINI in the last 72 hours.  Invalid input(s): CK, MB Hepatic Function Panel  Recent Labs  10/02/14 1826  PROT 6.3*  ALBUMIN 3.1*  AST 29  ALT 18  ALKPHOS 59  BILITOT 0.3   No results for input(s): CHOL in the last 72 hours. No results for input(s): PROTIME in the last 72 hours.  Imaging: Imaging results have been reviewed and No results found.  Cardiac Studies:  Assessment/Plan:  Resolving Acute renal injury multifactorial i.e. dehydration/ACE inhibitor/metformin Acute gastroenteritis Dehydration secondary to above Coronary artery disease history of silent MI in  the past status post PCI to CTO of left circumflex in the past Hypertension Diabetes mellitus Mild hyperkalemia Mild leukocytosis Metabolic acidosis Lumbar radiculopathy History of tobacco abuse GERD History of hiatus hernia Anxiety disorder Anemia rule out GI loss Plan Continue slow hydration as per renal service Check labs in a.m. GI evaluation as outpatient once renal function stabilizes  LOS: 1 day    Rinaldo Cloud 10/03/2014, 11:31 AM

## 2014-10-03 NOTE — Progress Notes (Signed)
Subjective: Interval History: has no complaints  Objective: Vital signs in last 24 hours: Temp:  [98.5 F (36.9 C)-99.3 F (37.4 C)] 98.5 F (36.9 C) (07/22 0548) Pulse Rate:  [68-94] 68 (07/22 0548) Resp:  [11-23] 18 (07/22 0548) BP: (96-159)/(57-87) 135/57 mmHg (07/22 0548) SpO2:  [95 %-100 %] 98 % (07/22 0548) Weight:  [180 lb (81.647 kg)] 180 lb (81.647 kg) (07/21 1143) Weight change:   Intake/Output from previous day: 07/21 0701 - 07/22 0700 In: 2332.5 [P.O.:360; I.V.:1972.5] Out: 1900 [Urine:1900] Intake/Output this shift:    Physical Exam  Constitutional: She appears well-developed and well-nourished. No distress.  HENT:  Head: Normocephalic.  Cardiovascular: Normal rate and regular rhythm.   No murmur heard. Pulmonary/Chest: Effort normal and breath sounds normal. She has no wheezes.  Abdominal: Soft. Bowel sounds are normal. She exhibits no distension. There is no tenderness.  Musculoskeletal: She exhibits no edema.  Skin: Skin is warm and dry.    Lab Results:  Recent Labs  10/02/14 1826 10/03/14 0532  WBC 10.5 8.4  HGB 12.6 11.0*  HCT 36.2 31.9*  PLT 245 240   BMET:  Recent Labs  10/02/14 1826 10/03/14 0532  NA 135 139  K 4.7 4.6  CL 99* 100*  CO2 21* 25  GLUCOSE 112* 81  BUN 83* 83*  CREATININE 10.49* 9.66*  CALCIUM 7.5* 7.4*   No results for input(s): PTH in the last 72 hours. Iron Studies: No results for input(s): IRON, TIBC, TRANSFERRIN, FERRITIN in the last 72 hours. CBG (last 3)   Recent Labs  10/02/14 1716 10/02/14 2228  GLUCAP 105* 85     Studies/Results: No results found.  Scheduled: . sodium chloride   Intravenous STAT  . aspirin EC  81 mg Oral Daily  . feeding supplement (ENSURE ENLIVE)  237 mL Oral BID BM  . heparin  5,000 Units Subcutaneous 3 times per day  . insulin aspart  0-9 Units Subcutaneous TID WC  . metoprolol succinate  50 mg Oral Daily    Assessment/Plan: 1 AKI- Creatinine 11.22 on admission b/l  around 0.8-0.95. Likely pre- renal due to decreased po intake, diarrhea, and ACEinh use. Pt also just completed course of cipro last week which can cause an acute kidney injury.   - continue NS at 50 cc/hr and NaHCO3 at 75 cc/hr, EF 84% on nuclear stress test 10/03/12, still volume depleted thus not concerned for hypernatremia from NaHCO3 at this time, will follow chem panel - FENa 4.5 % indicated post renal etiology however unlikely the case.  - strict I/O's, good UOP - urine culture NGTD  Urine indices indic  Poor tubular function.  Responding slowly to ivf/bicarb but better.  Suspect component ATN 2 Hyperkalemia-resolved 3 Anion gap metabolic acidosis- resolved w/ IVFs  4. Hypertension: hold ACEinh 5. Normocytic anemia- hgb 9.7, b/l around 12-14. Can consider anemia panel as outpatient.  6. Leukocytosis-resolved   LOS: 1 day   Gara Kroner 10/03/2014,7:56 AM I have seen and examined this patient and agree with the plan of care seen, examined,eval, counseled patient . Discussed with residents. .  Shneur Whittenburg L 10/03/2014, 1:36 PM

## 2014-10-03 NOTE — Progress Notes (Signed)
Initial Nutrition Assessment  DOCUMENTATION CODES:   Not applicable  INTERVENTION:   Provide Ensure Enlive po once daily, each supplement provides 350 kcal and 20 grams of protein.  Encourage adequate PO intake.  NUTRITION DIAGNOSIS:   Increased nutrient needs related to acute illness as evidenced by estimated needs.  GOAL:   Patient will meet greater than or equal to 90% of their needs  MONITOR:   PO intake, Supplement acceptance, Weight trends, Labs, I & O's  REASON FOR ASSESSMENT:   Malnutrition Screening Tool    ASSESSMENT:   74 year old female with past medical history significant for coronary artery disease history of silent non-Q-wave myocardial infarction in the past status post PCI 2 chronically occluded left circumflex in July 2013, hypertension, non-insulin-dependent diabetes mellitus controlled by diet, hypercholesteremia, degenerative joint disease, history of tobacco abuse in the past, positive family history of coronary artery disease, GERD, osteophyte is hernia, fibromyalgia, anxiety disorder, recently discharged from the hospital treated for colitis and acute renal failure/dehydration. Presents with renal failure.  Pt reports her appetite has been improving. Pt reports her meal completion has been 100%. PTA pt reports having a lack of appetite for 3 weeks and has only been able to consume 1 meal a day. Pt reports she has tried Ensure at home however has not been able to "keep it down". Per Epic weight records, pt with no significant weight loss. Pt currently has Ensure ordered and reports she would like to continue with them.   Pt with no observed significant fat or muscle mass loss.   Labs: Low GFR and chloride. High BUN and creatinine.   Diet Order:     Skin:  Reviewed, no issues  Last BM:  7/21  Height:   Ht Readings from Last 1 Encounters:  09/19/14  (1.676 m)    Weight:   Wt Readings from Last 1 Encounters:  10/02/14 180 lb (81.647 kg)     Ideal Body Weight:  59 kg  Wt Readings from Last 10 Encounters:  10/02/14 180 lb (81.647 kg)  09/20/14 188 lb 1.6 oz (85.322 kg)  09/22/11 168 lb (76.204 kg)    BMI:  Body mass index is 29.07 kg/(m^2).  Estimated Nutritional Needs:   Kcal:  1800-2000  Protein:  95-105 grams  Fluid:  1.8 - 2 L/day  EDUCATION NEEDS:   No education needs identified at this time  Roslyn Smiling, MS, RD, LDN Pager # [phone] After hours/ weekend pager # [phone]

## 2014-10-04 LAB — RENAL FUNCTION PANEL
ALBUMIN: 2.7 g/dL — AB (ref 3.5–5.0)
ANION GAP: 13 (ref 5–15)
BUN: 65 mg/dL — ABNORMAL HIGH (ref 6–20)
CHLORIDE: 100 mmol/L — AB (ref 101–111)
CO2: 25 mmol/L (ref 22–32)
CREATININE: 7.61 mg/dL — AB (ref 0.44–1.00)
Calcium: 7.2 mg/dL — ABNORMAL LOW (ref 8.9–10.3)
GFR calc Af Amer: 5 mL/min — ABNORMAL LOW (ref >60)
GFR calc non Af Amer: 5 mL/min — ABNORMAL LOW (ref >60)
Glucose, Bld: 207 mg/dL — ABNORMAL HIGH (ref 65–99)
POTASSIUM: 3.5 mmol/L (ref 3.5–5.1)
Phosphorus: 4.5 mg/dL (ref 2.5–4.6)
SODIUM: 138 mmol/L (ref 135–145)

## 2014-10-04 LAB — BASIC METABOLIC PANEL
Anion gap: 12 (ref 5–15)
BUN: 71 mg/dL — ABNORMAL HIGH (ref 6–20)
CALCIUM: 7.1 mg/dL — AB (ref 8.9–10.3)
CO2: 29 mmol/L (ref 22–32)
CREATININE: 8.17 mg/dL — AB (ref 0.44–1.00)
Chloride: 98 mmol/L — ABNORMAL LOW (ref 101–111)
GFR calc Af Amer: 5 mL/min — ABNORMAL LOW (ref >60)
GFR calc non Af Amer: 4 mL/min — ABNORMAL LOW (ref >60)
GLUCOSE: 95 mg/dL (ref 65–99)
Potassium: 3.9 mmol/L (ref 3.5–5.1)
Sodium: 139 mmol/L (ref 135–145)

## 2014-10-04 LAB — CBC
HCT: 32.8 % — ABNORMAL LOW (ref 36.0–46.0)
Hemoglobin: 11.1 g/dL — ABNORMAL LOW (ref 12.0–15.0)
MCH: 28.9 pg (ref 26.0–34.0)
MCHC: 33.8 g/dL (ref 30.0–36.0)
MCV: 85.4 fL (ref 78.0–100.0)
Platelets: 259 10*3/uL (ref 150–400)
RBC: 3.84 MIL/uL — ABNORMAL LOW (ref 3.87–5.11)
RDW: 12.9 % (ref 11.5–15.5)
WBC: 8.1 10*3/uL (ref 4.0–10.5)

## 2014-10-04 LAB — GLUCOSE, CAPILLARY
Glucose-Capillary: 98 mg/dL (ref 65–99)
Glucose-Capillary: 186 mg/dL — ABNORMAL HIGH (ref 65–99)
Glucose-Capillary: 109 mg/dL — ABNORMAL HIGH (ref 65–99)

## 2014-10-04 MED ORDER — ALPRAZOLAM 0.25 MG PO TABS
0.2500 mg | ORAL_TABLET | Freq: Every evening | ORAL | Status: DC | PRN
  Administered 2014-10-04 – 2014-10-05 (×2): 0.25 mg via ORAL
  Filled 2014-10-04 (×3): qty 1

## 2014-10-04 NOTE — Progress Notes (Signed)
Subjective: Interval History: has no complaints  Objective: Vital signs in last 24 hours: Temp:  [98.1 F (36.7 C)-98.7 F (37.1 C)] 98.7 F (37.1 C) (07/23 0531) Pulse Rate:  [71-77] 74 (07/23 0531) Resp:  [16-18] 16 (07/23 0531) BP: (146-167)/(66-92) 167/80 mmHg (07/23 0531) SpO2:  [96 %-98 %] 96 % (07/23 0531) Weight:  [173 lb (78.472 kg)] 173 lb (78.472 kg) (07/22 2230) Weight change: -7 lb (-3.175 kg)  Intake/Output from previous day: 07/22 0701 - 07/23 0700 In: 3421.7 [P.O.:480; I.V.:2941.7] Out: 2650 [Urine:2650] Intake/Output this shift: Total I/O In: -  Out: 725 [Urine:725]  Physical Exam  Constitutional: She appears well-developed and well-nourished. No distress.  HENT:  Head: Normocephalic.  Cardiovascular: Normal rate and regular rhythm.   No murmur heard. Pulmonary/Chest: Effort normal and breath sounds normal. No respiratory distress. She has no wheezes.  Abdominal: Soft. Bowel sounds are normal. She exhibits no distension. There is no tenderness.  Musculoskeletal: She exhibits no edema.  Skin: Skin is warm and dry.    Lab Results:  Recent Labs  10/03/14 0532 10/04/14 0302  WBC 8.4 8.1  HGB 11.0* 11.1*  HCT 31.9* 32.8*  PLT 240 259   BMET:   Recent Labs  10/03/14 0532 10/04/14 0302  NA 139 139  K 4.6 3.9  CL 100* 98*  CO2 25 29  GLUCOSE 81 95  BUN 83* 71*  CREATININE 9.66* 8.17*  CALCIUM 7.4* 7.1*   No results for input(s): PTH in the last 72 hours. Iron Studies: No results for input(s): IRON, TIBC, TRANSFERRIN, FERRITIN in the last 72 hours. CBG (last 3)   Recent Labs  10/03/14 1635 10/03/14 2228 10/04/14 0729  GLUCAP 87 106* 98     Studies/Results: No results found.  Scheduled: . aspirin EC  81 mg Oral Daily  . feeding supplement (ENSURE ENLIVE)  237 mL Oral Q1500  . heparin  5,000 Units Subcutaneous 3 times per day  . insulin aspart  0-9 Units Subcutaneous TID WC  . metoprolol succinate  50 mg Oral Daily     Assessment/Plan: 1 AKI- Creatinine 11.22 on admission b/l around 0.8-0.95. Likely pre- renal due to decreased po intake, diarrhea, and ACEinh use. Pt also just completed course of cipro last week which can cause an acute kidney injury.  Improving GFR.  Slow and all parameters indic tubular injury. - creatinine trending down, will take time for renal function to improve as possible component of ATN (low urine creatinine level) - continue NS IVFs -  Stop NaHCO3 - strict I/O's, good UOP - urine culture NGTD   2 Hyperkalemia-resolved 3 Anion gap metabolic acidosis- resolved w/ IVFs  4. Hypertension: hold ACEinh 5. Normocytic anemia- hgb 9.7, b/l around 12-14. Can consider anemia panel as outpatient.  6. Leukocytosis-resolved   LOS: 2 days   Gara Kroner 10/04/2014,8:48 AM I have seen and examined this patient and agree with the plan of care seen, eval, examined.  Patient situation discussed with her. .  Tongela Encinas L 10/04/2014, 12:11 PM

## 2014-10-04 NOTE — Progress Notes (Signed)
Subjective:  Doing well denies any chest pain or shortness of breath renal function slowly improving  Objective:  Vital Signs in the last 24 hours: Temp:  [98.1 F (36.7 C)-98.7 F (37.1 C)] 98.3 F (36.8 C) (07/23 0729) Pulse Rate:  [67-77] 67 (07/23 0729) Resp:  [16-17] 17 (07/23 0729) BP: (149-167)/(66-80) 154/71 mmHg (07/23 0729) SpO2:  [95 %-98 %] 95 % (07/23 0729) Weight:  [78.472 kg (173 lb)] 78.472 kg (173 lb) (07/22 2230)  Intake/Output from previous day: 07/22 0701 - 07/23 0700 In: 3421.7 [P.O.:480; I.V.:2941.7] Out: 2650 [Urine:2650] Intake/Output from this shift: Total I/O In: -  Out: 725 [Urine:725]  Physical Exam: Neck: no adenopathy, no carotid bruit, no JVD and supple, symmetrical, trachea midline Lungs: clear to auscultation bilaterally Heart: regular rate and rhythm, S1, S2 normal and Soft systolic murmur noted Abdomen: soft, non-tender; bowel sounds normal; no masses,  no organomegaly Extremities: extremities normal, atraumatic, no cyanosis or edema  Lab Results:  Recent Labs  10/03/14 0532 10/04/14 0302  WBC 8.4 8.1  HGB 11.0* 11.1*  PLT 240 259    Recent Labs  10/04/14 0302 10/04/14 1000  NA 139 138  K 3.9 3.5  CL 98* 100*  CO2 29 25  GLUCOSE 95 207*  BUN 71* 65*  CREATININE 8.17* 7.61*   No results for input(s): TROPONINI in the last 72 hours.  Invalid input(s): CK, MB Hepatic Function Panel  Recent Labs  10/02/14 1826 10/04/14 1000  PROT 6.3*  --   ALBUMIN 3.1* 2.7*  AST 29  --   ALT 18  --   ALKPHOS 59  --   BILITOT 0.3  --    No results for input(s): CHOL in the last 72 hours. No results for input(s): PROTIME in the last 72 hours.  Imaging: Imaging results have been reviewed and No results found.  Cardiac Studies:  Assessment/Plan:  Resolving Acute renal injury multifactorial i.e. dehydration/ACE inhibitor/metformin Acute gastroenteritis Dehydration secondary to above Coronary artery disease history of silent  MI in the past status post PCI to CTO of left circumflex in the past Hypertension Diabetes mellitus Mild hyperkalemia Mild leukocytosis Metabolic acidosis Lumbar radiculopathy History of tobacco abuse GERD History of hiatus hernia Anxiety disorder Plan DC bicarbonate drip Continue IV normal saline Monitor renal function in a.m.   LOS: 2 days    Rinaldo Cloud 10/04/2014, 11:08 AM

## 2014-10-04 NOTE — Progress Notes (Signed)
CSW met with pt and her sons in the ED on 10/02/14. Requested information was provided to pt and her sons, re: ALF care for pt's husband.  No other social work needs identified.  CSW signing off.  Please re-consult as appropriate.

## 2014-10-04 NOTE — Progress Notes (Signed)
Pt c/o unable to sleep since last night, requested for something to help her get to sleep. Paged and notified Dr. Sharyn Lull, responded and ordered to give Xanax 0.25mg  PO at HS prn for sleep per telephone. Order verified per readback. Will administer and continue to monitor pt.

## 2014-10-05 LAB — CBC
HCT: 35.5 % — ABNORMAL LOW (ref 36.0–46.0)
Hemoglobin: 11.9 g/dL — ABNORMAL LOW (ref 12.0–15.0)
MCH: 29.4 pg (ref 26.0–34.0)
MCHC: 33.5 g/dL (ref 30.0–36.0)
MCV: 87.7 fL (ref 78.0–100.0)
Platelets: 299 10*3/uL (ref 150–400)
RBC: 4.05 MIL/uL (ref 3.87–5.11)
RDW: 13 % (ref 11.5–15.5)
WBC: 8.5 10*3/uL (ref 4.0–10.5)

## 2014-10-05 LAB — RENAL FUNCTION PANEL
ALBUMIN: 3.2 g/dL — AB (ref 3.5–5.0)
Anion gap: 11 (ref 5–15)
BUN: 53 mg/dL — ABNORMAL HIGH (ref 6–20)
CO2: 29 mmol/L (ref 22–32)
CREATININE: 6.46 mg/dL — AB (ref 0.44–1.00)
Calcium: 7.7 mg/dL — ABNORMAL LOW (ref 8.9–10.3)
Chloride: 100 mmol/L — ABNORMAL LOW (ref 101–111)
GFR calc Af Amer: 7 mL/min — ABNORMAL LOW (ref >60)
GFR calc non Af Amer: 6 mL/min — ABNORMAL LOW (ref >60)
Glucose, Bld: 103 mg/dL — ABNORMAL HIGH (ref 65–99)
Phosphorus: 4.8 mg/dL — ABNORMAL HIGH (ref 2.5–4.6)
Potassium: 3.4 mmol/L — ABNORMAL LOW (ref 3.5–5.1)
SODIUM: 140 mmol/L (ref 135–145)

## 2014-10-05 LAB — GLUCOSE, CAPILLARY
GLUCOSE-CAPILLARY: 91 mg/dL (ref 65–99)
Glucose-Capillary: 98 mg/dL (ref 65–99)
Glucose-Capillary: 116 mg/dL — ABNORMAL HIGH (ref 65–99)
Glucose-Capillary: 135 mg/dL — ABNORMAL HIGH (ref 65–99)

## 2014-10-05 MED ORDER — POTASSIUM CHLORIDE CRYS ER 20 MEQ PO TBCR
40.0000 meq | EXTENDED_RELEASE_TABLET | Freq: Once | ORAL | Status: AC
  Administered 2014-10-05: 40 meq via ORAL
  Filled 2014-10-05: qty 2

## 2014-10-05 NOTE — Progress Notes (Signed)
Subjective: Interval History: has no complaints. Denies loose BMs.   Objective: Vital signs in last 24 hours: Temp:  [98 F (36.7 C)-98.7 F (37.1 C)] 98 F (36.7 C) (07/24 0740) Pulse Rate:  [66-77] 66 (07/24 0740) Resp:  [16-18] 17 (07/24 0740) BP: (150-161)/(74-84) 161/84 mmHg (07/24 0740) SpO2:  [94 %-98 %] 96 % (07/24 0740) Weight change:   Intake/Output from previous day: 07/23 0701 - 07/24 0700 In: 3268.8 [P.O.:660; I.V.:2608.8] Out: 2675 [Urine:2675] Intake/Output this shift: Total I/O In: -  Out: 1000 [Urine:1000]  Physical Exam  Constitutional: She appears well-developed and well-nourished. No distress.  HENT:  Head: Normocephalic.  Cardiovascular: Normal rate and regular rhythm.   No murmur heard. Pulmonary/Chest: Effort normal and breath sounds normal. No respiratory distress. She has no wheezes.  Abdominal: Soft. Bowel sounds are normal. She exhibits no distension. There is no tenderness.  Musculoskeletal: She exhibits no edema.  Skin: Skin is warm and dry.    Lab Results:  Recent Labs  10/04/14 0302 10/05/14 0530  WBC 8.1 8.5  HGB 11.1* 11.9*  HCT 32.8* 35.5*  PLT 259 299   BMET:   Recent Labs  10/04/14 1000 10/05/14 0530  NA 138 140  K 3.5 3.4*  CL 100* 100*  CO2 25 29  GLUCOSE 207* 103*  BUN 65* 53*  CREATININE 7.61* 6.46*  CALCIUM 7.2* 7.7*   No results for input(s): PTH in the last 72 hours. Iron Studies: No results for input(s): IRON, TIBC, TRANSFERRIN, FERRITIN in the last 72 hours. CBG (last 3)   Recent Labs  10/04/14 1158 10/04/14 1657 10/05/14 0739  GLUCAP 186* 109* 98     Studies/Results: No results found.  Scheduled: . aspirin EC  81 mg Oral Daily  . feeding supplement (ENSURE ENLIVE)  237 mL Oral Q1500  . heparin  5,000 Units Subcutaneous 3 times per day  . insulin aspart  0-9 Units Subcutaneous TID WC  . metoprolol succinate  50 mg Oral Daily    Assessment/Plan: 1 AKI-  - creatinine trending down, renal  function recovering from ischemic insult, patient is tolerating po with good UOP. From nephrology standpoint she can be discharged home with close follow up to check chem panel twice a week to ensure appropriate improvement in renal function. Stop fluids,If ok, canD/C in am - stopped NS IVF as patient is tolerating po -  Orthostatic vitals negative.  - strict I/O's, good UOP - KDur po once for low K of 3.2 2 Hyperkalemia-resolved 3 Anion gap metabolic acidosis- resolved w/ IVFs  4. Hypertension: hold ACEinh 5. Normocytic anemia- hgb 9.7, b/l around 12-14. Can consider anemia panel as outpatient.  6. Leukocytosis-resolved   LOS: 3 days   Gara Kroner 10/05/2014,9:00 AM I have seen and examined this patient and agree with the plan of care seen, eval, examined, counseled patient and discussed with resident. .  Dex Blakely L 10/05/2014, 11:46 AM

## 2014-10-05 NOTE — Plan of Care (Signed)
Problem: Phase I Progression Outcomes Goal: Initial discharge plan identified Outcome: Completed/Met Date Met:  10/05/14 Home alone

## 2014-10-05 NOTE — Progress Notes (Signed)
Subjective:  Doing well denies any chest pain or shortness of breath renal function slowly improving urine output adequate.  Objective:  Vital Signs in the last 24 hours: Temp:  [98 F (36.7 C)-98.7 F (37.1 C)] 98 F (36.7 C) (07/24 0740) Pulse Rate:  [66-77] 66 (07/24 0740) Resp:  [16-18] 17 (07/24 0740) BP: (150-161)/(74-84) 161/84 mmHg (07/24 0740) SpO2:  [94 %-98 %] 96 % (07/24 0740)  Intake/Output from previous day: 07/23 0701 - 07/24 0700 In: 3268.8 [P.O.:660; I.V.:2608.8] Out: 2675 [Urine:2675] Intake/Output from this shift: Total I/O In: -  Out: 1000 [Urine:1000]  Physical Exam: Neck: no adenopathy, no carotid bruit, no JVD and supple, symmetrical, trachea midline Lungs: clear to auscultation bilaterally Heart: regular rate and rhythm, S1, S2 normal and Soft systolic murmur noted no S3 gallop Abdomen: soft, non-tender; bowel sounds normal; no masses,  no organomegaly Extremities: extremities normal, atraumatic, no cyanosis or edema  Lab Results:  Recent Labs  10/04/14 0302 10/05/14 0530  WBC 8.1 8.5  HGB 11.1* 11.9*  PLT 259 299    Recent Labs  10/04/14 1000 10/05/14 0530  NA 138 140  K 3.5 3.4*  CL 100* 100*  CO2 25 29  GLUCOSE 207* 103*  BUN 65* 53*  CREATININE 7.61* 6.46*   No results for input(s): TROPONINI in the last 72 hours.  Invalid input(s): CK, MB Hepatic Function Panel  Recent Labs  10/02/14 1826  10/05/14 0530  PROT 6.3*  --   --   ALBUMIN 3.1*  < > 3.2*  AST 29  --   --   ALT 18  --   --   ALKPHOS 59  --   --   BILITOT 0.3  --   --   < > = values in this interval not displayed. No results for input(s): CHOL in the last 72 hours. No results for input(s): PROTIME in the last 72 hours.  Imaging: Imaging results have been reviewed and No results found.  Cardiac Studies:  Assessment/Plan:  Resolving Acute renal injury multifactorial i.e. dehydration/ACE inhibitor/metformin Acute gastroenteritis Dehydration secondary to  above Coronary artery disease history of silent MI in the past status post PCI to CTO of left circumflex in the past Hypertension Diabetes mellitus Lumbar radiculopathy History of tobacco abuse GERD History of hiatus hernia Anxiety disorder Plan Continue slow hydration with normal saline Increase ambulation Check renal function in a.m.  LOS: 3 days    Emily Palmer 10/05/2014, 9:06 AM

## 2014-10-06 LAB — RENAL FUNCTION PANEL
Albumin: 3 g/dL — ABNORMAL LOW (ref 3.5–5.0)
Anion gap: 9 (ref 5–15)
BUN: 41 mg/dL — ABNORMAL HIGH (ref 6–20)
CO2: 24 mmol/L (ref 22–32)
Calcium: 7.8 mg/dL — ABNORMAL LOW (ref 8.9–10.3)
Chloride: 107 mmol/L (ref 101–111)
Creatinine, Ser: 5.29 mg/dL — ABNORMAL HIGH (ref 0.44–1.00)
GFR calc Af Amer: 8 mL/min — ABNORMAL LOW
GFR calc non Af Amer: 7 mL/min — ABNORMAL LOW
Glucose, Bld: 98 mg/dL (ref 65–99)
Phosphorus: 4.5 mg/dL (ref 2.5–4.6)
Potassium: 3.2 mmol/L — ABNORMAL LOW (ref 3.5–5.1)
Sodium: 140 mmol/L (ref 135–145)

## 2014-10-06 LAB — GLUCOSE, CAPILLARY
Glucose-Capillary: 90 mg/dL (ref 65–99)
Glucose-Capillary: 149 mg/dL — ABNORMAL HIGH (ref 65–99)

## 2014-10-06 MED ORDER — POTASSIUM CHLORIDE CRYS ER 20 MEQ PO TBCR
40.0000 meq | EXTENDED_RELEASE_TABLET | Freq: Once | ORAL | Status: AC
  Administered 2014-10-06: 40 meq via ORAL
  Filled 2014-10-06: qty 2

## 2014-10-06 NOTE — Discharge Instructions (Signed)
End-Stage Kidney Disease °The kidneys are two organs that lie on either side of the spine between the middle of the back and the front of the abdomen. The kidneys:  °· Remove wastes and extra water from the blood.   °· Produce important hormones. These help keep bones strong, regulate blood pressure, and help create red blood cells.   °· Balance the fluids and chemicals in the blood and tissues. °End-stage kidney disease occurs when the kidneys are so damaged that they cannot do their job. When the kidneys cannot do their job, life-threatening problems occur. The body cannot stay clean and strong without the help of the kidneys. In end-stage kidney disease, the kidneys cannot get better. You need a new kidney or treatments to do some of the work healthy kidneys do in order to stay alive. °CAUSES  °End-stage kidney disease usually occurs when a long-lasting (chronic) kidney disease gets worse. It may also occur after the kidneys are suddenly damaged (acute kidney injury).  °SYMPTOMS  °· Swelling (edema) of the legs, ankles, or feet.   °· Tiredness (lethargy).   °· Nausea or vomiting.   °· Confusion.   °· Problems with urination, such as:   °¨ Decreased urine production.   °¨ Frequent urination, especially at night.   °¨ Frequent accidents in children who are potty trained.   °· Muscle twitches and cramps.   °· Persistent itchiness.   °· Loss of appetite.   °· Headaches.   °· Abnormally dark or light skin.   °· Numbness in the hands or feet.   °· Easy bruising.   °· Frequent hiccups.   °· Menstruation stops. °DIAGNOSIS  °Your health care provider will measure your blood pressure and take some tests. These may include:  °· Urine tests.   °· Blood tests.   °· Imaging tests, such as:   °¨ An ultrasound exam.   °¨ Computed tomography (CT). °· A kidney biopsy. °TREATMENT  °There are two treatments for end-stage kidney disease:  °· A procedure that removes toxic wastes from the body (dialysis).   °· Receiving a new kidney  (kidney transplant). °Both of these treatments have serious risks and consequences. Your health care provider will help you determine which treatment is best for you based on your health, age, and other factors. In addition to having dialysis or a kidney transplant, you may need to take medicines to control high blood pressure (hypertension) and cholesterol and to decrease phosphorus levels in your blood.  °HOME CARE INSTRUCTIONS °· Follow your prescribed diet.   °· Take medicines only as directed by your health care provider.   °· Do not take any new medicines (prescription, over-the-counter, or nutritional supplements) unless approved by your health care provider. Many medicines can worsen your kidney damage or need to have the dose adjusted.   °· Keep all follow-up visits as directed by your health care provider. °MAKE SURE YOU: °· Understand these instructions. °· Will watch your condition. °· Will get help right away if you are not doing well or get worse. °Document Released: 05/21/2003 Document Revised: 07/15/2013 Document Reviewed: 10/28/2011 °[employer]® Patient Information ©2015 [employer]. This information is not intended to replace advice given to you by your health care provider. Make sure you discuss any questions you have with your health care provider. ° °

## 2014-10-06 NOTE — Discharge Summary (Signed)
Discharge summary dictated on 10/06/2014 dictation number is [phone]

## 2014-10-06 NOTE — Progress Notes (Signed)
Subjective: Interval History: no complaints this morning, denies any weakness.   Objective: Vital signs in last 24 hours: Temp:  [97.6 F (36.4 C)-98.9 F (37.2 C)] 97.9 F (36.6 C) (07/25 0529) Pulse Rate:  [68-72] 68 (07/25 0529) Resp:  [16-18] 16 (07/25 0529) BP: (144-162)/(65-77) 149/77 mmHg (07/25 0529) SpO2:  [94 %-97 %] 94 % (07/25 0529) Weight:  [178 lb 8 oz (80.967 kg)] 178 lb 8 oz (80.967 kg) (07/24 2036) Weight change:   Intake/Output from previous day: 07/24 0701 - 07/25 0700 In: 1460 [P.O.:960; I.V.:500] Out: 4900 [Urine:4900] Intake/Output this shift:    Physical Exam  Constitutional: She appears well-developed and well-nourished. No distress.  HENT:  Head: Normocephalic.  Cardiovascular: Normal rate and regular rhythm.   No murmur heard. Pulmonary/Chest: Effort normal and breath sounds normal. No respiratory distress. She has no wheezes.  Abdominal: Soft. Bowel sounds are normal. She exhibits no distension. There is no tenderness.  Musculoskeletal: She exhibits no edema.  Skin: Skin is warm and dry.    Lab Results:  Recent Labs  10/04/14 0302 10/05/14 0530  WBC 8.1 8.5  HGB 11.1* 11.9*  HCT 32.8* 35.5*  PLT 259 299   BMET:   Recent Labs  10/05/14 0530 10/06/14 0532  NA 140 140  K 3.4* 3.2*  CL 100* 107  CO2 29 24  GLUCOSE 103* 98  BUN 53* 41*  CREATININE 6.46* 5.29*  CALCIUM 7.7* 7.8*   No results for input(s): PTH in the last 72 hours. Iron Studies: No results for input(s): IRON, TIBC, TRANSFERRIN, FERRITIN in the last 72 hours. CBG (last 3)   Recent Labs  10/05/14 1207 10/05/14 1640 10/05/14 2223  GLUCAP 116* 91 135*     Studies/Results: No results found.  Scheduled: . aspirin EC  81 mg Oral Daily  . feeding supplement (ENSURE ENLIVE)  237 mL Oral Q1500  . heparin  5,000 Units Subcutaneous 3 times per day  . insulin aspart  0-9 Units Subcutaneous TID WC  . metoprolol succinate  50 mg Oral Daily     Assessment/Plan: 1 AKI-  - creatinine trending down, renal function recovering from ischemic insult, patient is tolerating po with good UOP. - KDur po once for low K of 3.2 - pt will need f/u with Dr. Darrick Penna and renal function monitored twice weekly to ensure improving renal function. Nephrology will sign off.  2 Hyperkalemia-resolved 3 Anion gap metabolic acidosis- resolved w/ IVFs  4. Hypertension: hold ACEinh 5. Normocytic anemia- hgb 9.7, b/l around 12-14. Can consider anemia panel as outpatient.  6. Leukocytosis-resolved   LOS: 4 days   Gara Kroner 10/06/2014,7:59 AM

## 2014-10-06 NOTE — Progress Notes (Signed)
Patient discharged per MD. Discharge instructions reviewed with patient. Patient to f/u with Dr. Sharyn Lull and [facility]. No IV access to removed. Escorted out via wheelchair and NT. Bess Kinds, RN

## 2014-10-06 NOTE — Care Management Important Message (Signed)
Important Message  Patient Details  Name: Emily Palmer MRN: [id number] Date of Birth: May 30, 1940   Medicare Important Message Given:       Yvonna Alanis 10/06/2014, 2:43 PM

## 2014-10-07 ENCOUNTER — Other Ambulatory Visit: Payer: Self-pay | Admitting: Orthopedic Surgery

## 2014-10-07 DIAGNOSIS — M5442 Lumbago with sciatica, left side: Secondary | ICD-10-CM

## 2014-10-07 LAB — CULTURE, BLOOD (ROUTINE X 2)
CULTURE: NO GROWTH
CULTURE: NO GROWTH

## 2014-10-07 NOTE — Discharge Summary (Signed)
NAMEIZETTA, Emily Palmer          ACCOUNT NO.:  [id number]  MEDICAL RECORD NO.:  [id number]  LOCATION:  6E08C                        FACILITY:  [facility]  PHYSICIAN:  Eduardo Osier. Sharyn Lull, M.D. DATE OF BIRTH:  1940-09-27  DATE OF ADMISSION:  10/02/2014 DATE OF DISCHARGE:  10/06/2014                              DISCHARGE SUMMARY   ADMITTING DIAGNOSES: 1. Acute renal injury, multifactorial, i.e. dehydration/ACE-     inhibitor/metformin. 2. Acute gastroenteritis. 3. Dehydration secondary to above. 4. Coronary artery disease, history of silent myocardial infarction in     the past, status post percutaneous transluminal coronary     angioplasty, stenting to chronically occluded left circumflex in     the past. 5. Hypertension. 6. Diabetes mellitus. 7. Mild hyperkalemia. 8. Mild leukocytosis. 9. Metabolic acidosis. 10.Lumbar radiculopathy. 11.History of tobacco abuse. 12.Gastroesophageal reflux disease. 13.History of hiatus hernia. 14.Anxiety disorder. 15.Anemia, rule out gastrointestinal loss.  FINAL DIAGNOSES: 1. Resolving acute renal injury. 2. Status post acute gastroenteritis. 3. Status post dehydration. 4. Coronary artery disease, history of silent myocardial infarction in     the past, status post percutaneous coronary intervention to     chronically occluded left circumflex in the past, stable. 5. Hypertension. 6. Diabetes mellitus. 7. Lumbar radiculopathy. 8. History of tobacco abuse. 9. Gastroesophageal reflux disease. 10.History of hiatus hernia. 11.Anxiety disorder. 12.Status post metabolic acidosis. 13.Anemia of chronic disease. 14.Status post hyperkalemia.  DISCHARGE HOME MEDICATIONS: 1. Aspirin 81 mg one tablet daily. 2. Atorvastatin 40 mg daily. 3. Metoprolol succinate 50 mg daily. 4. Multivitamin capsule one daily. 5. Nitrostat 0.4 mg sublingual p.r.n. 6. Vitamin E 400 units daily.  The patient has been advised to stop     lisinopril, metformin and  alprazolam.  DIET:  Low salt, low cholesterol, renal diet.  The patient has been advised to drink plenty of fluids.  The patient has been advised to avoid NSAIDs.  FOLLOWUP:  Follow up with me in next few days.  Follow up with Renal Service as scheduled.  We will check her basic metabolic panel by the end of this week.  Follow up with GI as scheduled as outpatient.  CONDITION AT DISCHARGE:  Stable.  BRIEF HISTORY AND HOSPITAL COURSE:  [name] is 74 year old female with past medical history significant for coronary artery disease; history of silent non-Q-wave myocardial infarction in the past, status post PCI to chronically-occluded left circumflex in July of 2003; hypertension; non-insulin-dependent diabetes mellitus, recently started on metformin; hypercholesteremia; degenerative joint disease; history of tobacco abuse in the past; positive family history of coronary artery disease; gastroesophageal reflux disease; osteoarthritis; fibromyalgia; anxiety disorder, recently discharged from the hospital, treated for colitis and acute renal failure/dehydration.  She came to the ER, complaining of diarrhea associated with vague abdominal discomfort and nausea for the last few days.  Denies any fever or chills.  Denies any urinary complaints.  States her urine output has been reduced.  The patient saw GI as outpatient because of recent colitis and had lab work done as outpatient, also noted to have creatinine of 11.2, last creatinine on September 20, 2014, approximately 2 weeks ago was 0.95.  The patient was advised to go to ED.  The patient denies any chest  pain or shortness of breath.  The patient was also noted to be mildly acidotic with hyperkalemia.  The patient received IV fluid bolus and was started on bicarb drip with improvement in her symptoms.  The patient states she has been voiding urine now.  PAST MEDICAL HISTORY:  As above.  PHYSICAL EXAMINATION:  GENERAL:  She was alert,  awake and oriented x3. VITAL SIGNS:  Blood pressure was 135/63, pulse 89, she was afebrile. HEENT:  Conjunctiva was pink. NECK:  Supple.  No JVD.  No bruit. LUNGS:  Clear to auscultation without rhonchi or rales. CARDIOVASCULAR:  S1, S2 was normal.  There was soft, systolic murmur. No S3, gallop or pericardial rub. ABDOMEN:  Soft.  Bowel sounds were present.  There was minimal tenderness in the left lower quadrant tenderness.  There was no guarding or rebound. EXTREMITIES:  There was no clubbing, cyanosis, or edema. NEUROLOGIC:  Grossly intact.  LABORATORY DATA:  Sodium was 132, potassium 5.3, BUN 93, creatinine 11.22.  Hemoglobin was 9.7, hematocrit 28, white count of 13.4.  Her bicarb was 18.  Repeat electrolytes; sodium 135, potassium 4.7, BUN 83, creatinine 10.49, bicarb 21.  On October 04, 2014; BUN is 71, creatinine 8.17, which is trending down, bicarb is 29.  Yesterday, BUN 53 and creatinine 6.46.  Today, BUN 41 and creatinine 5.29, which is trending down.  Bicarb is 24.  Last hemoglobin was 11.9, hematocrit 35.5, white count of 8.5.  Her blood sugar was 103.  Today this morning was 98.  BRIEF HOSPITAL COURSE:  The patient was admitted to regular floor, was started on initially IV normal saline along with sodium bicarb drip. Renal Service consultation was obtained.  The patient recently had CT of the abdomen, which showed no evidence of hydronephrosis.  The patient is voiding urine okay, is able to tolerate p.o.  Her diarrhea has completely resolved, so is her abdominal pain.  The patient is ambulating in hallway without any problems, tolerating p.o. fluids.  The patient is off IV fluids now.  The patient is eager to go home.  The patient will be followed up in my office in next few days.  We will recheck her renal function and she will follow up with Renal Service and GI as scheduled.  The patient has been advised to drink plenty of fluids and refrain from NSAID and stop  lisinopril and also metformin as above. The patient has been advised to monitor blood sugar daily.     Eduardo Osier. Sharyn Lull, M.D.     [facility]  D:  10/06/2014  T:  10/07/2014  Job:  [id number]

## 2014-10-20 ENCOUNTER — Other Ambulatory Visit: Payer: Self-pay | Admitting: Orthopedic Surgery

## 2014-10-20 ENCOUNTER — Inpatient Hospital Stay
Admission: RE | Admit: 2014-10-20 | Discharge: 2014-10-20 | Disposition: A | Discharge status: Home / Self Care | Payer: Self-pay | Source: Ambulatory Visit | Attending: Orthopedic Surgery | Admitting: Orthopedic Surgery

## 2014-10-20 ENCOUNTER — Ambulatory Visit
Admission: RE | Admit: 2014-10-20 | Discharge: 2014-10-20 | Disposition: A | Discharge status: Home / Self Care | Payer: Medicare PPO | Source: Ambulatory Visit | Attending: Orthopedic Surgery | Admitting: Orthopedic Surgery

## 2014-10-20 DIAGNOSIS — R52 Pain, unspecified: Secondary | ICD-10-CM

## 2014-10-20 DIAGNOSIS — M5442 Lumbago with sciatica, left side: Secondary | ICD-10-CM

## 2014-10-20 MED ORDER — DIAZEPAM 5 MG PO TABS
5.0000 mg | ORAL_TABLET | Freq: Once | ORAL | Status: AC
  Administered 2014-10-20: 5 mg via ORAL

## 2014-10-20 MED ORDER — IOHEXOL 180 MG/ML  SOLN
15.0000 mL | Freq: Once | INTRAMUSCULAR | Status: AC | PRN
  Administered 2014-10-20: 15 mL via INTRATHECAL

## 2014-10-20 NOTE — Discharge Instructions (Signed)

## 2015-10-04 IMAGING — RF DG MYELOGRAPHY LUMBAR INJ LUMBOSACRAL
12 of 13 series · 12 of 13 positions shown · non-contrast
Comparison: Lumbar spine MRI 09/13/2014 from [REDACTED]

CLINICAL DATA: Low back pain.  Left leg pain and numbness.
TECHNIQUE: Contiguous axial images were obtained through the Lumbar spine after
the intrathecal infusion of contrast. Coronal and sagittal
reconstructions were obtained of the axial image sets.

[Series 2: (hospital) · 1 of 1 slices shown]
[im 1/1]
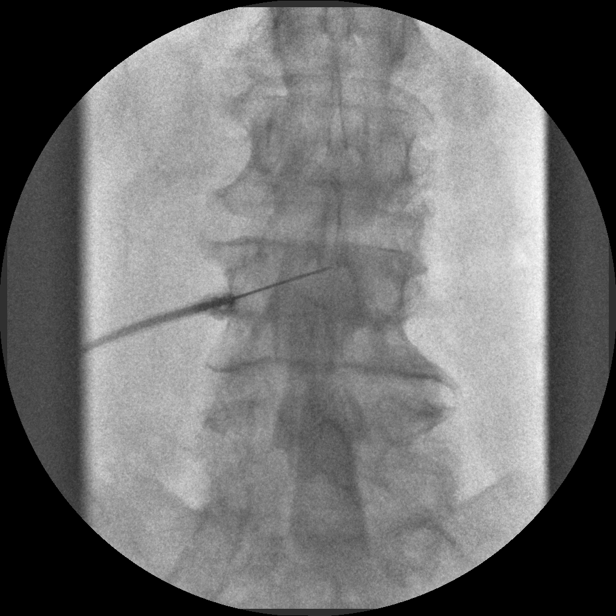

[Series 3: myelogram  white · 1 of 1 slices shown (1 of 11)]
[im 1/1]
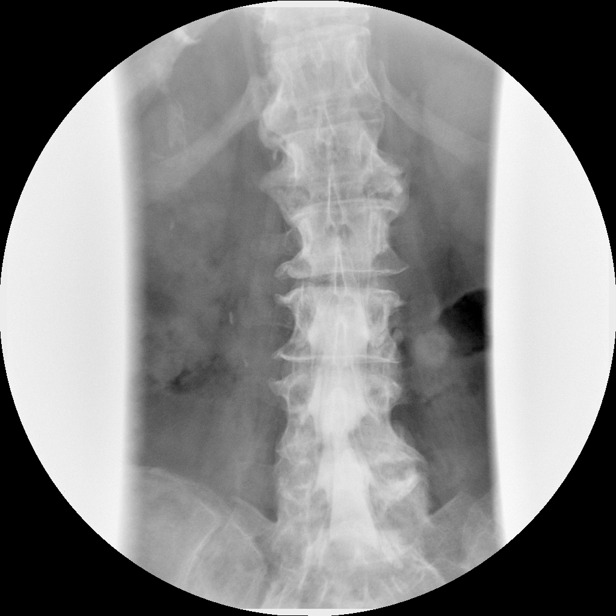

[Series 4: myelogram  white · 1 of 1 slices shown (2 of 11)]
[im 1/1]
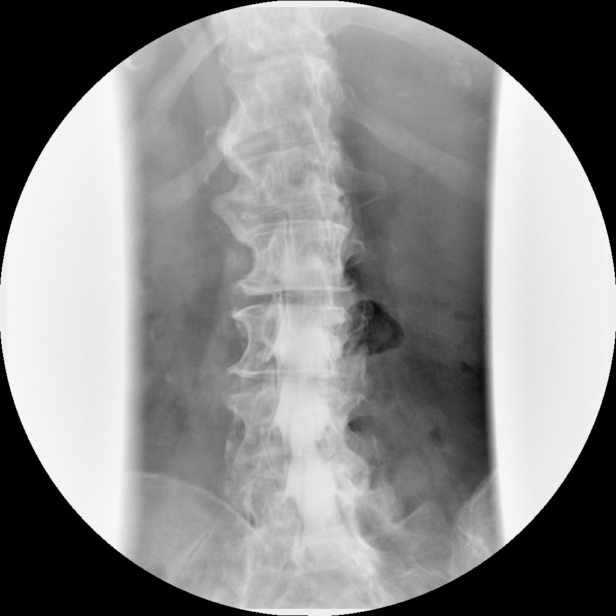

[Series 5: myelogram  white · 1 of 1 slices shown (3 of 11)]
[im 1/1]
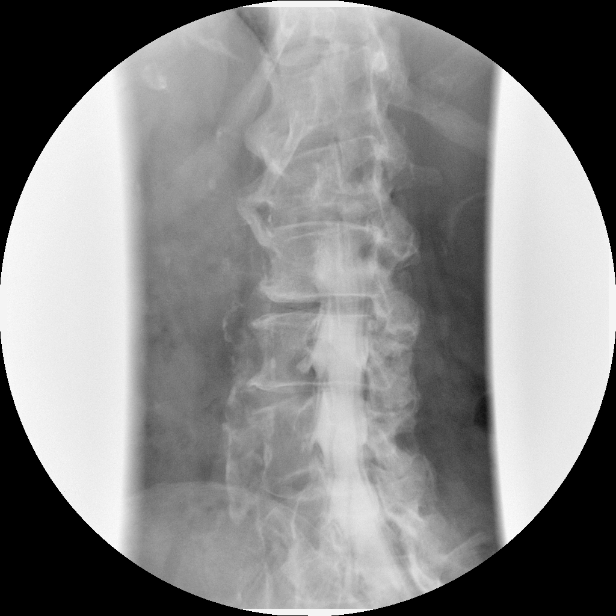

[Series 6: myelogram  white · 1 of 1 slices shown (4 of 11)]
[im 1/1]
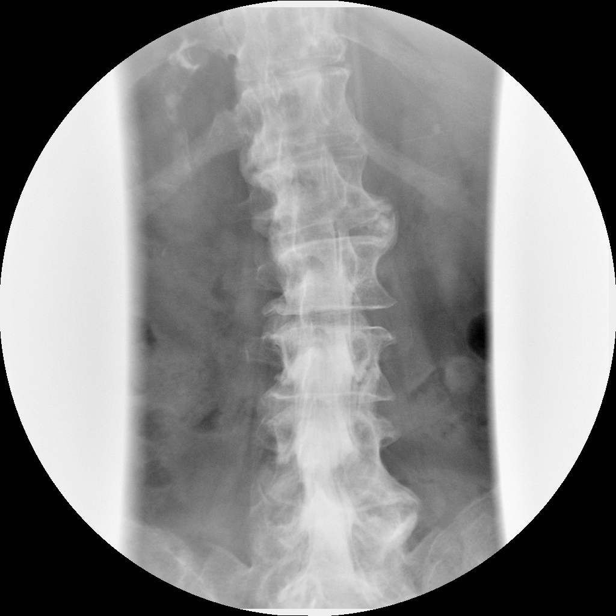

[Series 7: myelogram  white · 1 of 1 slices shown (5 of 11)]
[im 1/1]
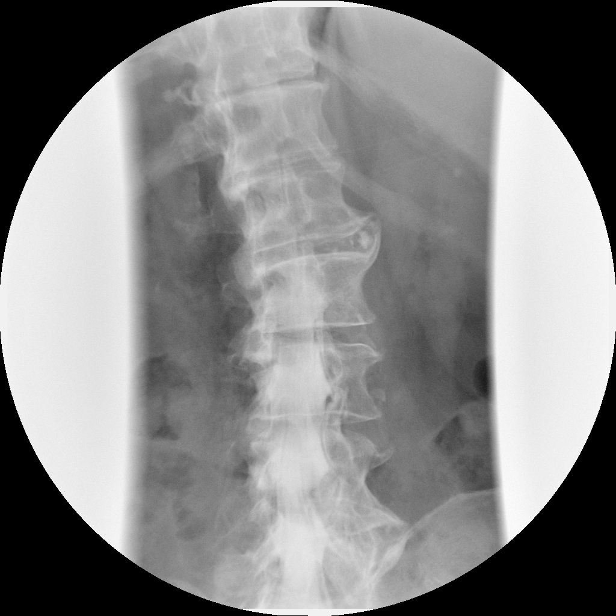

[Series 9: myelogram  white · 1 of 1 slices shown (6 of 11)]
[im 1/1]
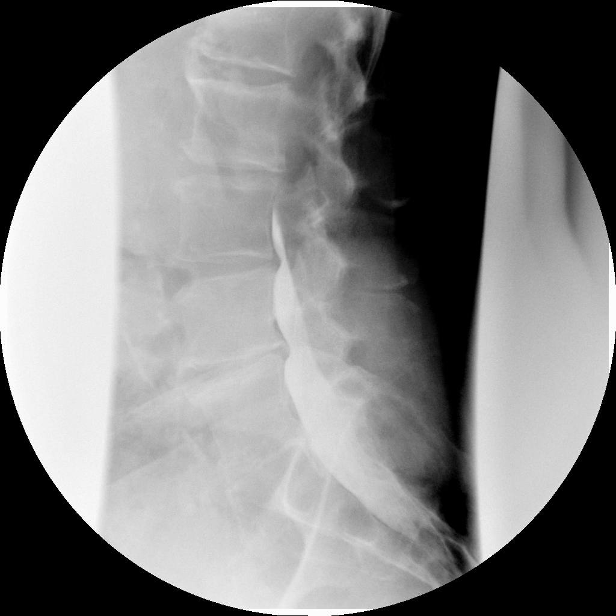

[Series 10: myelogram  white · 1 of 1 slices shown (7 of 11)]
[im 1/1]
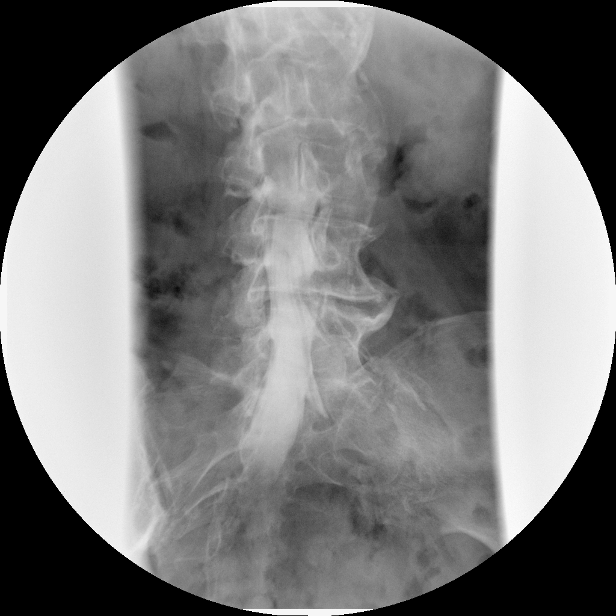

[Series 11: myelogram  white · 1 of 1 slices shown (8 of 11)]
[im 1/1]
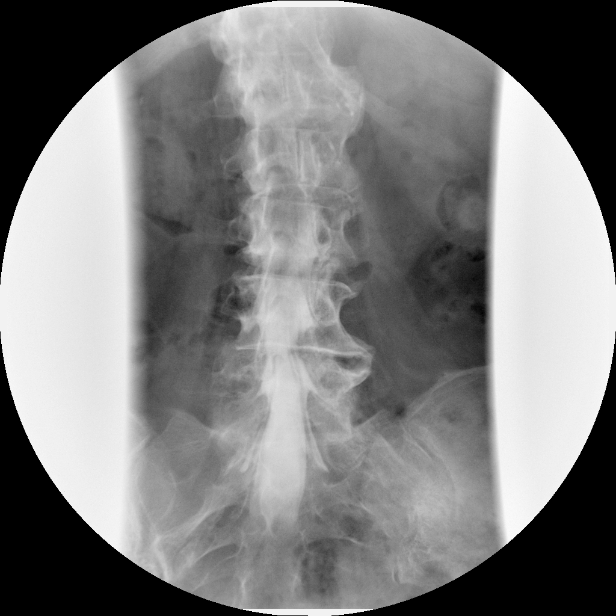

[Series 12: myelogram  white · 1 of 1 slices shown (9 of 11)]
[im 1/1]
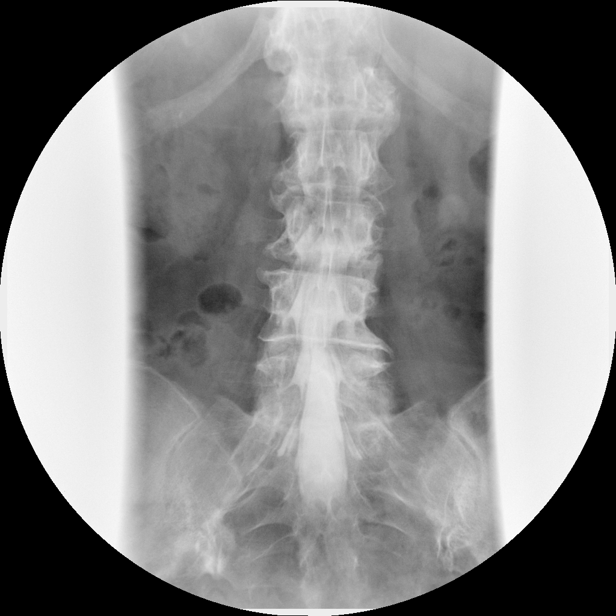

[Series 13: myelogram  white · 1 of 1 slices shown (10 of 11)]
[im 1/1]
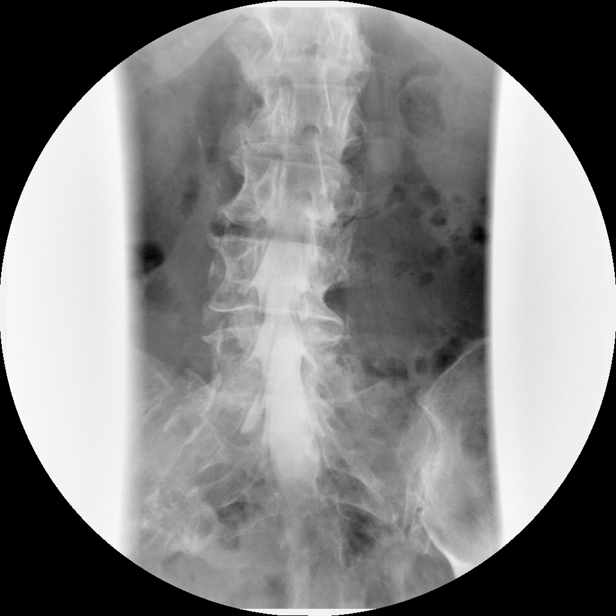

[Series 14: myelogram  white · 1 of 1 slices shown (11 of 11)]
[im 1/1]
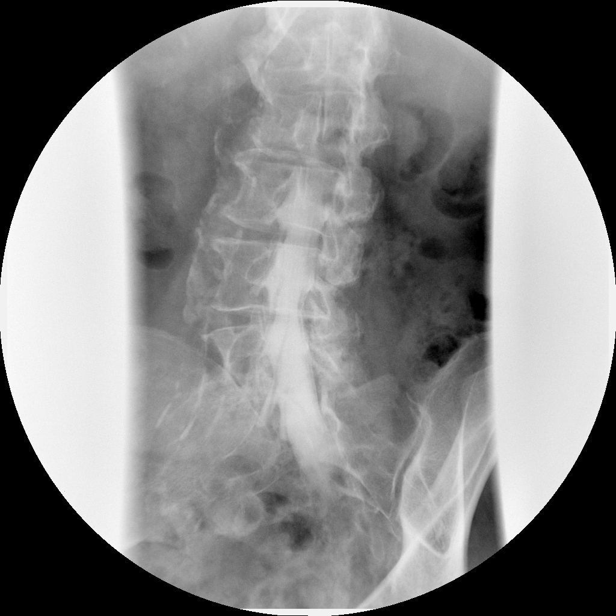

[12 of 13 positions shown; findings below may reference images not displayed]

EXAM:
LUMBAR MYELOGRAM

FLUOROSCOPY TIME:  Fluoroscopy Time (in minutes and seconds): 1
minute 1 second

Number of Acquired Images:  12

PROCEDURE:
After thorough discussion of risks and benefits of the procedure
including bleeding, infection, injury to nerves, blood vessels,
adjacent structures as well as headache and CSF leak, written and
oral informed consent was obtained. Consent was obtained by Dr.
Love Romond Taylor Pace. Time out form was completed.

Patient was positioned prone on the fluoroscopy table. Local
anesthesia was provided with 1% lidocaine without epinephrine after
prepped and draped in the usual sterile fashion. Puncture was
performed at L3-4 using a 3 1/2 inch 22-gauge spinal needle via a
left paramedian approach. Using a single pass through the dura, the
needle was placed within the thecal sac, with return of clear CSF.
15 mL of [id number] was injected into the thecal sac, with normal
opacification of the nerve roots and cauda equina consistent with
free flow within the subarachnoid space.

I personally performed the lumbar puncture and administered the
intrathecal contrast. I also personally supervised acquisition of
the myelogram images.
FINDINGS: LUMBAR MYELOGRAM FINDINGS:

There are 5 non rib-bearing lumbar type vertebral bodies. Vertebral
alignment is within normal limits without evidence of listhesis
during flexion or extension. There is a small ventral extradural
defect at L4-5 which does not appear to significantly change with
standing. There is evidence of right greater than left lateral
recess narrowing at L4-5. Small ventral extradural defects are also
noted at L2-3 and L3-4.

CT LUMBAR MYELOGRAM FINDINGS:

There is slight upper lumbar dextroscoliosis with apex at L1-2.
There is no significant listhesis. No vertebral compression fracture
is seen. Several small Schmorl's nodes are noted. There is severe
disc space height loss at L5-S1 with partial osseous fusion across
the disc space. Disc space narrowing is mild at L2-3 and moderate at
L4-5 with vacuum disc noted at both levels. L1 vertebral body
hemangioma is again seen. The conus medullaris terminates at the
superior aspect of L2. Diffuse aortoiliac atherosclerotic
calcification is noted.

T11-12: Moderate-sized, partially calcified left central disc
protrusion and mild facet and ligamentum flavum hypertrophy result
in mild spinal stenosis with mild spinal cord flattening, similar to
the prior MRI. No neural foraminal stenosis.

T12-L1: Mild facet and ligamentum flavum hypertrophy with
ligamentous calcification. No disc herniation or stenosis.

L1-2: Trace disc bulging and mild facet and ligamentum flavum
hypertrophy without stenosis, unchanged.

L2-3: Mild disc bulging asymmetric to the left and mild facet and
ligamentum flavum hypertrophy result in mild left neural foraminal
stenosis without spinal stenosis, unchanged. Disc approaches but
does not clearly displace the left L2 nerve lateral to the foramen.

L3-4: Mild disc bulging, superimposed shallow left subarticular disc
protrusion, and moderate right and mild left facet arthrosis results
in minimal left lateral recess narrowing without spinal canal or
neural foraminal stenosis, unchanged. No mass effect on the left L4
nerve root.

L4-5: Moderate circumferential disc bulging, endplate osteophytosis,
and moderate right and mild left facet hypertrophy result in
mild-to-moderate bilateral lateral recess stenosis and mild
bilateral neural foraminal stenosis, unchanged. No significant
spinal stenosis.

L5-S1: Circumferential disc osteophyte complex mildly asymmetric to
the left and moderate facet arthrosis result in mild left lateral
recess stenosis and mild bilateral neural foraminal stenosis without
spinal stenosis, unchanged. No definite mass effect on the left S1
nerve root.
IMPRESSION: 1. Moderate multilevel lumbar disc and facet degeneration, most
notable at L4-5 where there is mild-to-moderate lateral recess
stenosis bilaterally.
2. Mild left lateral recess and bilateral neural foraminal stenosis
at L5-S1.
3. Minimal left lateral recess stenosis at L3-4.
4. Mild spinal stenosis at T11-12 due to a left central disc
protrusion.

## 2016-12-09 ENCOUNTER — Other Ambulatory Visit: Payer: Self-pay | Admitting: Cardiology

## 2016-12-09 DIAGNOSIS — R079 Chest pain, unspecified: Secondary | ICD-10-CM

## 2016-12-23 ENCOUNTER — Encounter (HOSPITAL_COMMUNITY): Payer: Self-pay

## 2016-12-23 ENCOUNTER — Encounter (HOSPITAL_COMMUNITY): Payer: Medicare PPO

## 2017-01-06 ENCOUNTER — Ambulatory Visit (HOSPITAL_COMMUNITY)
Admission: RE | Admit: 2017-01-06 | Discharge: 2017-01-06 | Disposition: A | Discharge status: Home / Self Care | Payer: Medicare PPO | Source: Ambulatory Visit | Attending: Cardiology | Admitting: Cardiology

## 2017-01-06 DIAGNOSIS — R079 Chest pain, unspecified: Secondary | ICD-10-CM | POA: Insufficient documentation

## 2017-01-06 MED ORDER — TECHNETIUM TC 99M TETROFOSMIN IV KIT
30.0000 | PACK | Freq: Once | INTRAVENOUS | Status: AC | PRN
  Administered 2017-01-06: 30 via INTRAVENOUS

## 2017-01-06 MED ORDER — REGADENOSON 0.4 MG/5ML IV SOLN
0.4000 mg | Freq: Once | INTRAVENOUS | Status: AC
  Administered 2017-01-06: 0.4 mg via INTRAVENOUS

## 2017-01-06 MED ORDER — REGADENOSON 0.4 MG/5ML IV SOLN
INTRAVENOUS | Status: AC
  Administered 2017-01-06: 0.4 mg via INTRAVENOUS
  Filled 2017-01-06: qty 5

## 2017-01-06 MED ORDER — TECHNETIUM TC 99M TETROFOSMIN IV KIT
10.0000 | PACK | Freq: Once | INTRAVENOUS | Status: AC | PRN
  Administered 2017-01-06: 10 via INTRAVENOUS

## 2017-01-06 NOTE — Progress Notes (Signed)
Pre lexi scan  

## 2017-01-06 NOTE — Progress Notes (Signed)
Post lexiscan 

## 2019-10-13 DEATH — deceased
